# Patient Record
Sex: Female | Born: 1965
Health system: Southern US, Community
[De-identification: ages and names within clinical notes are randomized; demographics above are authoritative.]

## PROBLEM LIST (undated history)

## (undated) DIAGNOSIS — E119 Type 2 diabetes mellitus without complications: Secondary | ICD-10-CM

## (undated) DIAGNOSIS — B019 Varicella without complication: Secondary | ICD-10-CM

## (undated) HISTORY — DX: Varicella without complication: B01.9

## (undated) HISTORY — PX: ABDOMINAL HYSTERECTOMY: SHX81

## (undated) HISTORY — PX: OTHER SURGICAL HISTORY: SHX169

---

## 2000-02-08 ENCOUNTER — Other Ambulatory Visit: Admission: RE | Admit: 2000-02-08 | Discharge: 2000-02-08 | Payer: Self-pay | Admitting: Obstetrics and Gynecology

## 2002-06-24 ENCOUNTER — Encounter: Admission: RE | Admit: 2002-06-24 | Discharge: 2002-06-24 | Payer: Self-pay | Admitting: Family Medicine

## 2002-06-24 ENCOUNTER — Encounter: Payer: Self-pay | Admitting: Family Medicine

## 2003-01-15 DIAGNOSIS — R87619 Unspecified abnormal cytological findings in specimens from cervix uteri: Secondary | ICD-10-CM | POA: Insufficient documentation

## 2004-12-02 ENCOUNTER — Emergency Department: Payer: Self-pay | Admitting: Emergency Medicine

## 2005-01-02 ENCOUNTER — Observation Stay (HOSPITAL_COMMUNITY): Admission: RE | Admit: 2005-01-02 | Discharge: 2005-01-03 | Payer: Self-pay | Admitting: Obstetrics and Gynecology

## 2005-01-02 ENCOUNTER — Encounter (INDEPENDENT_AMBULATORY_CARE_PROVIDER_SITE_OTHER): Payer: Self-pay | Admitting: Specialist

## 2005-07-26 ENCOUNTER — Ambulatory Visit: Payer: Self-pay | Admitting: Unknown Physician Specialty

## 2005-08-20 ENCOUNTER — Ambulatory Visit: Payer: Self-pay

## 2006-04-10 ENCOUNTER — Encounter: Admission: RE | Admit: 2006-04-10 | Discharge: 2006-04-10 | Payer: Self-pay | Admitting: Obstetrics and Gynecology

## 2006-04-11 ENCOUNTER — Ambulatory Visit: Payer: Self-pay

## 2006-07-29 ENCOUNTER — Encounter (INDEPENDENT_AMBULATORY_CARE_PROVIDER_SITE_OTHER): Payer: Self-pay | Admitting: Radiology

## 2006-07-29 ENCOUNTER — Encounter: Admission: RE | Admit: 2006-07-29 | Discharge: 2006-07-29 | Payer: Self-pay | Admitting: Obstetrics and Gynecology

## 2006-12-17 ENCOUNTER — Ambulatory Visit: Payer: Self-pay | Admitting: Unknown Physician Specialty

## 2007-04-13 ENCOUNTER — Encounter: Admission: RE | Admit: 2007-04-13 | Discharge: 2007-04-13 | Payer: Self-pay | Admitting: General Surgery

## 2007-05-11 ENCOUNTER — Encounter (INDEPENDENT_AMBULATORY_CARE_PROVIDER_SITE_OTHER): Payer: Self-pay | Admitting: General Surgery

## 2007-05-11 ENCOUNTER — Ambulatory Visit (HOSPITAL_BASED_OUTPATIENT_CLINIC_OR_DEPARTMENT_OTHER): Admission: RE | Admit: 2007-05-11 | Discharge: 2007-05-11 | Payer: Self-pay | Admitting: General Surgery

## 2007-09-22 ENCOUNTER — Encounter: Admission: RE | Admit: 2007-09-22 | Discharge: 2007-09-22 | Payer: Self-pay | Admitting: Obstetrics and Gynecology

## 2008-02-24 ENCOUNTER — Encounter: Admission: RE | Admit: 2008-02-24 | Discharge: 2008-02-24 | Payer: Self-pay | Admitting: Obstetrics and Gynecology

## 2008-02-25 ENCOUNTER — Encounter (INDEPENDENT_AMBULATORY_CARE_PROVIDER_SITE_OTHER): Payer: Self-pay | Admitting: Surgery

## 2008-02-25 ENCOUNTER — Inpatient Hospital Stay (HOSPITAL_COMMUNITY): Admission: AD | Admit: 2008-02-25 | Discharge: 2008-02-26 | Payer: Self-pay | Admitting: Surgery

## 2008-07-04 ENCOUNTER — Ambulatory Visit (HOSPITAL_BASED_OUTPATIENT_CLINIC_OR_DEPARTMENT_OTHER): Admission: RE | Admit: 2008-07-04 | Discharge: 2008-07-04 | Payer: Self-pay | Admitting: Surgery

## 2008-07-04 ENCOUNTER — Encounter (INDEPENDENT_AMBULATORY_CARE_PROVIDER_SITE_OTHER): Payer: Self-pay | Admitting: Surgery

## 2009-01-31 ENCOUNTER — Ambulatory Visit: Payer: Self-pay | Admitting: Family Medicine

## 2009-03-29 ENCOUNTER — Ambulatory Visit: Payer: Self-pay | Admitting: Family Medicine

## 2010-01-03 ENCOUNTER — Ambulatory Visit: Payer: Self-pay | Admitting: Family Medicine

## 2010-02-04 ENCOUNTER — Encounter: Payer: Self-pay | Admitting: General Surgery

## 2010-02-04 ENCOUNTER — Encounter: Payer: Self-pay | Admitting: Obstetrics and Gynecology

## 2010-02-05 ENCOUNTER — Encounter: Payer: Self-pay | Admitting: Obstetrics and Gynecology

## 2010-05-01 LAB — COMPREHENSIVE METABOLIC PANEL
BUN: 8 mg/dL (ref 6–23)
Calcium: 8.4 mg/dL (ref 8.4–10.5)
Glucose, Bld: 118 mg/dL — ABNORMAL HIGH (ref 70–99)
Sodium: 135 mEq/L (ref 135–145)
Total Protein: 7.2 g/dL (ref 6.0–8.3)

## 2010-05-01 LAB — CULTURE, ROUTINE-ABSCESS

## 2010-05-01 LAB — CBC
MCHC: 34.2 g/dL (ref 30.0–36.0)
MCV: 91.8 fL (ref 78.0–100.0)
RBC: 3.81 MIL/uL — ABNORMAL LOW (ref 3.87–5.11)

## 2010-05-01 LAB — MAGNESIUM: Magnesium: 1.8 mg/dL (ref 1.5–2.5)

## 2010-05-01 LAB — ANAEROBIC CULTURE

## 2010-05-09 ENCOUNTER — Ambulatory Visit: Payer: Self-pay | Admitting: Orthopedic Surgery

## 2010-05-29 NOTE — Op Note (Signed)
[name], MARCONI NO.:  [id number]   MEDICAL RECORD NO.:  [id number]          PATIENT TYPE:  INP   LOCATION:  2609                         FACILITY:  [facility]   PHYSICIAN:  Currie Paris, M.D.DATE OF BIRTH:  May 15, 1965   DATE OF PROCEDURE:  DATE OF DISCHARGE:                               OPERATIVE REPORT   PREOPERATIVE DIAGNOSIS:  Recurrent breast abscess, mastitis.   POSTOPERATIVE DIAGNOSIS:  Recurrent breast abscess, mastitis.   OPERATION:  Drainage of chronic breast abscess with biopsy of subareolar  tissue.   SURGEON:  Currie Paris, MD   ANESTHESIA:  General.   CLINICAL HISTORY:  This is a 45 year old lady who has presented back to  the office with a painful tender red mass just medial aspect of the  areola.  She has had previous infection there, which was operated on and  debrided by Dr. Carolynne Edouard approximately 9 months ago.  At this time, she has  been on doxycycline for a week with essentially no improvement.  In  fact, the area has gotten a little bit worse.  Because of this, she came  to the office and after evaluation, we thought that she should go to the  operating room for a debridement.  We discussed the situation and the  fact that this may well start from her nipple and at some point, we may  have to go back and either do a nipple excision or debridement of some  ductal structures, but at this time, we thought best to just open the  area up and get a better culture.   DESCRIPTION OF PROCEDURE:  The patient was seen in the holding area and  we reviewed the plans as noted above.  I initialed the right breast as  the operative site.   The patient was taken to the operating room.  After satisfactory general  anesthesia had been obtained, the breast was prepped and draped.  The  time-out was done.   I made a curvilinear incision at the medial aspect of the areola and  entered very thickened subcutaneous tissue with what looked like  some  old sebum.  There was also some fairly hard sub-nipple tissue that  looked like chronic inflammation.  I debrided this out and tried to get  into some fresh clean, fatty subcutaneous tissue.  I did send portions  of the tissue for pathology and also took some sloughs for cultures,  although there was not a lot of frank pus, just a little bit of purulent  material present and mainly inflammatory changes.   I placed some moist 2 x 2's in for packing.  Sterile dressings.  The  patient tolerated the procedure well and there were no complications.      Currie Paris, M.D.  Electronically Signed     CJS/MEDQ  D:  02/26/2008  T:  02/26/2008  Job:  [id number]

## 2010-05-29 NOTE — Op Note (Signed)
NAMESHIRON, WHETSEL              ACCOUNT NO.:  [id number]   MEDICAL RECORD NO.:  [id number]          PATIENT TYPE:  AMB   LOCATION:  DSC                          FACILITY:  [facility]   PHYSICIAN:  Currie Paris, M.D.DATE OF BIRTH:  1965/11/18   DATE OF PROCEDURE:  07/04/2008  DATE OF DISCHARGE:                               OPERATIVE REPORT   PREOPERATIVE DIAGNOSIS:  Recurring subareolar right breast abscesses.   POSTOPERATIVE DIAGNOSIS:  Recurring subareolar right breast abscesses.   OPERATION:  Excision of right nipple and subareolar tissue.   SURGEON:  Currie Paris, MD   ANESTHESIA:  General.   CLINICAL HISTORY:  Ms. Toft is a 45 year old lady who has had  recurrent subareolar abscesses with 2 trips to the operating room for  drainage and more recently another episode drained in the office.  This  area has resolved.  I thought that these had been coming from some  chronic ductal infection and that either opening up of that abduct or  excision of the nipple would be likely to eliminate these recurring  infections.  After discussion with the patient, she agreed to have that  done.  She really wishes to take whole nipple off rather than to risk  any chance that we would have to come back if we just did a ductal  opening up and I told I was truly be able to find the duct in question.  In addition, I warned her that she might have recurring infections even  with this being done, but I thought that this would diminish the chance  for that to happen.   DESCRIPTION OF PROCEDURE:  The patient was seen in the holding area and  we reviewed the plans for surgery and she had no further questions.  We  both identified AND marked the right breast as the operative side.   The patient was taken to the operating room and after satisfactory  general anesthesia had been obtained the right breast was prepped and  draped.  The time-out was done.   I made a circular incision  around the right nipple and then extended it  medially across the scar from where her last abscess had been drained.  I then excised the nipple with this subjacent ductal tissue making sure  that I got out anything that looked like chronic inflammation.  There  was no evidence of acute infection today.   Once I made sure everything was dry, I put Marcaine in to help with  postop analgesia.  I closed the skin with some 4-0 nylon sutures and  sterile dressings were applied.   The patient tolerated the procedure well and there were no  complications.      Currie Paris, M.D.  Electronically Signed     CJS/MEDQ  D:  07/04/2008  T:  07/05/2008  Job:  [id number]   cc:   Hal Morales, M.D.

## 2010-05-29 NOTE — Op Note (Signed)
NAMEYOLTZIN, Madison Armstrong              ACCOUNT NO.:  [id number]   MEDICAL RECORD NO.:  [id number]          PATIENT TYPE:  AMB   LOCATION:  DSC                          FACILITY:  [facility]   PHYSICIAN:  Ollen Gross. Vernell Morgans, M.D. DATE OF BIRTH:  [birth year]   DATE OF PROCEDURE:  05/11/2007  DATE OF DISCHARGE:                               OPERATIVE REPORT   PREOPERATIVE DIAGNOSIS:  Right breast mass, possible recurrent abscess.   POSTOPERATIVE DIAGNOSIS:  Right breast mass, possible recurrent abscess.   PROCEDURE:  Excision of right breast mass.   SURGEON:  Ollen Gross. Vernell Morgans, MD   ANESTHESIA:  General via LMA.   PROCEDURE:  After informed consent was obtained, the patient was brought  to the operating room and placed in supine position on the operating  table.  After an induction of general anesthesia, the patient's right  breast was prepped with Betadine and draped in usual sterile manner.  Just medial to the nipple in the right breast, the patient had a small  palpable mass.  This was much smaller than it was several weeks when we  saw her in the clinic.  She had been on doxycycline for this with  improvement.  There is no redness or sign of infection today.  A small  curvilinear incision was made on the medial edge of the areola adjacent  to this mass with a 15 blade knife.  This incision was carried down  through the skin and into the subcutaneous tissue sharply with the  electrocautery and tenotomy scissors.  The mass itself could be  palpated.  The mass was then excised from the rest of the subcutaneous  tissue underneath the nipple and areola.  This was all done sharply with  tenotomy scissors.  Once the mass was removed, it was sent to pathology  for further evaluation.  Hemostasis was achieved using the Bovie  electrocautery.  The wound was irrigated with copious amounts of saline.  No signs of infection were identified.  The wound was then infiltrated  with 0.25% Marcaine.  The deep  layer of the wound was closed with  interrupted 3-0 Vicryl stitch and the skin was closed with interrupted 4-  0 Monocryl subcuticular stitches.  Dermabond dressing was applied.  The  patient tolerated the procedure well.  At the end of the case, all  needle, sponge, and instrument counts were correct.  The patient was  then awakened and taken to recovery room in stable condition.      Ollen Gross. Vernell Morgans, M.D.  Electronically Signed     PST/MEDQ  D:  05/11/2007  T:  05/11/2007  Job:  [id number]

## 2010-05-29 NOTE — Consult Note (Signed)
Madison Armstrong, SWARTZLANDER              ACCOUNT NO.:  [id number]   MEDICAL RECORD NO.:  [id number]          PATIENT TYPE:  INP   LOCATION:  2609                         FACILITY:  [facility]   PHYSICIAN:  Pramod P. Pearlean Brownie, MD    DATE OF BIRTH:  10-18-1965   DATE OF CONSULTATION:  DATE OF DISCHARGE:                                 CONSULTATION   REFERRING PHYSICIAN:  Currie Paris, MD   REASON FOR REFERRAL:  Altered mental status and involuntary movements.   CLINICAL HISTORY:  This is a 45 year old African American lady who had  incision and drainage of breast today.  Procedure was uneventful.  She  was in the Post Anesthesia Recovery Unit and was initially alright for  15 minutes.  She then complained of severe pain in the breast, was given  1 mg of IV Dilaudid.  Following that, she started having some bizarre  involuntary movements, involving the right upper extremity more than  left as well as head and neck, which was intermittent.  She has also not  been responsive, opening her eyes, or responding to painful stimuli.  Intermittently, however, she has been following commands, moving  extremities purposefully.  She does not complain of headache.  There is  no focal extremity weakness noted.  The patient is unable to provide  history as she speaks very little and no detailed history is available  in the chart.   PHYSICAL EXAMINATION:  GENERAL:  She is lying comfortably.  VITAL SIGNS:  Her vital signs are stable.  She is afebrile.  NEUROLOGIC:  She is lying in bed with eyes closed.  She does open eyes  to command.  Her eyes are in primary position.  She has a bizarre  involuntary jerky movements involving the right upper extremity,  shoulder, head and neck, and location of the left arm as well.  These do  not appear to be in sink.  When she is distracted, these movements are  less.  When I raise her arm up and hold it in my hand, she stops doing  it.  She moves all 4 extremities against  gravity and can support all  extremities well, however, when she is given a painful stimuli, she does  not respond appropriately in her feet.  Her reflexs are 1+ symmetric.  Plantars are down going.  Her coordination, sensation, gait cannot be  tested reliably.   Data reviewed and present chart was reviewed.   IMPRESSION:  A 45 year old lady with some onset of depressed sensorium  and involuntary movements, involving head, neck, and upper extremities  in a bizarre fashion.  I doubt this a present stroke or seizure.  Conversion reaction or adverse reaction to Dilaudid more likely.  I will  recommend admitting overnight for observation.  Use nonnarcotic pain  medications and avoid narcotics.  Consider elective psychiatric referral  for counseling.  Kindly call for questions.           ______________________________  Sunny Schlein. Pearlean Brownie, MD     PPS/MEDQ  D:  02/25/2008  T:  02/26/2008  Job:  [id number]

## 2010-06-01 NOTE — Discharge Summary (Signed)
Madison Armstrong, Madison Armstrong NO.:  [id number]   MEDICAL RECORD NO.:  [id number]          PATIENT TYPE:  INP   LOCATION:  2609                         FACILITY:  [facility]   PHYSICIAN:  Currie Paris, M.D.DATE OF BIRTH:  16-Apr-1965   DATE OF ADMISSION:  02/25/2008  DATE OF DISCHARGE:  02/26/2008                               DISCHARGE SUMMARY   FINAL DIAGNOSES:  1. Recurrent right breast abscess.  2. Adverse reaction (possible) to scopolamine patch.  3. History of multiple sclerosis.   CLINICAL HISTORY:  [name] is a 45 year old lady who has had a  recurrent breast abscess that needed to be drained under general  anesthesia.  She had been seen in the office earlier in the day and  scheduled for the afternoon date of admission.   HOSPITAL COURSE:  The patient was taken to the operating room and  underwent drainage and debridement of a subareolar breast abscess with  biopsies of some of the tissues.  She tolerated the procedure well.   Postoperatively, she had some very dystonic movements, unable to see  rolling eyes, poor response to deep pain.  Neurological consultation was  obtained.  It was unclear what the reaction was but it was then noted  from her husband that she had multiple sclerosis diagnosed in 1997,  treated at  [facility] but on no meds.  It was thought perhaps because of  multiple sclerosis, she had an adverse reaction to her scopolamine patch  which was removed.   By the next morning, she was much improved and neurologically stable.  After discussing situation with her husband, she had had similar events  after her hysterectomy in 1990 when pregnant.  It is not clear what this  etiology is and of note is the fact that she had not mentioned this in  any of her preoperative evaluations to either her surgeons or the  anesthesia doctors.   At any rate, the wound was cleaned with packing and she was discharged  to be sent home and followed up in our  office for wound care.  She will  make her routine followups regarding her multiple sclerosis with her  other physicians.      Currie Paris, M.D.  Electronically Signed     CJS/MEDQ  D:  03/31/2008  T:  03/31/2008  Job:  [id number]

## 2010-06-01 NOTE — H&P (Signed)
NAME:  Madison Armstrong, Madison Armstrong              ACCOUNT NO.:  [id number]   MEDICAL RECORD NO.:  [id number]          PATIENT TYPE:  AMB   LOCATION:                                [facility]   PHYSICIAN:  Naima A. Dillard, M.D. DATE OF BIRTH:  [birth year]   DATE OF ADMISSION:  01/02/2005  DATE OF DISCHARGE:                                HISTORY & PHYSICAL   CHIEF COMPLAINT:  Metrorrhagia and pelvic pain.   The patient is a 45 year old, gravida 2, para 2, presented back to me on  November 06, 2004 for a second opinion.  She was slated to have a  hysterectomy for pelvic and metrorrhagia.  The patient states she was  diagnosed with adenomyosis on ultrasound and MRI and reports she had an  endometrial biopsy which was benign.  She states that her menses occur every  two weeks and last for 3-5 days.  The patient denies having any history of  fibroids or being on hormone therapy.  She has denied being on any new  medications, does not have any menopausal symptoms.  Does have some colicky  abdominal pain and  TSH, prolactin within normal limits.  Ultrasound  significant for uterus measuring 9.4 x 6.29 x 6.6 and a hypoechoic area in  the endometrium which was significant for a questionable polyp.  The patient  had an endometrial biopsy done by me, on November 30, 2004, which showed  secretory endometrium.   PAST OBSTETRICAL HISTORY:  Vaginal delivery x2 without any complication.   PAST MEDICAL HISTORY:  As above.   FAMILY HISTORY:  Maternal grandmother with ovarian cancer.   SOCIAL HISTORY:  Negative for tobacco, alcohol, and drug abuse.   PAST SURGICAL HISTORY:  1.  Laparoscopic ovarian cystectomy in 2005.  2.  Bilateral tubal ligation.   The patient has no known drug allergies.   MEDICATIONS:  None.   REVIEW OF SYSTEMS:  GENITOURINARY:  As above.  GI:  Unremarkable.  RESPIRATORY:  Unremarkable.  MUSCULOSKELETAL:  Unremarkable.  PSYCHIATRIC:  Unremarkable.   PHYSICAL EXAMINATION:  VITAL  SIGNS:  The patient weighs 235 pounds.  Her  blood pressure is 110/60.  HEENT:  Pupils are equal.  Hearing is normal.  Throat is clear.  Thyroid is  not enlarged.  HEART:  Regular rate and rhythm.  LUNGS:  Clear to auscultation bilaterally.  BREASTS:  Have no masses, discharge, skin changes, or nipple retraction.  BACK:  No CVA tenderness.  ABDOMEN:  Nontender without any masses or organomegaly.  EXTREMITIES:  No cyanosis, clubbing, or edema.  NEURO:  Within normal limits.  PELVIC:  Vulva vaginal exam within normal limits.  Cervix is nontender  without any lesions.  Uterus is top normal size, mobile, nontender.  Adnexa  is no masses and nontender.   ASSESSMENT:  1.  Metrorrhagia.  2.  Pelvic pain.  3.  Questionable adenomyosis.   PLAN:  All treatments were reviewed with the patient including but not  limited to observation, hormone treatment, D&C with ablation, Mirena, and  hysterectomy.  The patient has decided upon a hysterectomy.  We  will plan a  laparoscopically-assisted vaginal hysterectomy.  With some history of pelvic  pain, she may have some adhesions.  The patient understands that a  hysterectomy may not remedy her pain especially if endometriosis is found.  The patient desires to retain her ovaries.  She understands the risk of  hysterectomy include but are not limited to bleeding, infection, damage to  internal organs such as bowel, bladder, major blood vessels.  She also  signed a hysterectomy consent in the office.      Naima A. Normand Sloop, M.D.  Electronically Signed     NAD/MEDQ  D:  01/01/2005  T:  01/01/2005  Job:  [id number]

## 2010-06-01 NOTE — Op Note (Signed)
NAME:  Madison Armstrong, Madison Armstrong              ACCOUNT NO.:  [id number]   MEDICAL RECORD NO.:  [id number]          PATIENT TYPE:  AMB   LOCATION:  SDC                           FACILITY:  [facility]   PHYSICIAN:  Naima A. Dillard, M.D. DATE OF BIRTH:  Sep 05, 1965   DATE OF PROCEDURE:  01/02/2005  DATE OF DISCHARGE:                                 OPERATIVE REPORT   PREOPERATIVE DIAGNOSES:  1.  Metrorrhagia and pelvic pain.  2.  Questionable adenomyosis.   POSTOPERATIVE DIAGNOSES:  1.  Metrorrhagia and pelvic pain.  2.  Questionable adenomyosis.  3.  Pelvic adhesions.   OPERATION/PROCEDURE:  Laparoscopic-assisted vaginal hysterectomy with lysis  of adhesions.   SURGEON:  Naima A. Normand Sloop, M.D.   ASSISTANTMarquis Lunch. Powell, P.A.-C.   ANESTHESIA:  General endotracheal tube and local anesthesia.   SPECIMENS:  Uterus and cervix.   ESTIMATED BLOOD LOSS:  500 mL.   URINARY OUTPUT:  750 mL.   IV FLUIDS:  2500 mL crystalloid.   COMPLICATIONS:  None.   DISPOSITION:  The patient went to PACU in stable condition.   DESCRIPTION OF PROCEDURE:  The patient was taken to the operating room where  she was given general anesthesia and placed in the dorsal lithotomy position  and prepped and draped in the normal sterile fashion.  A bivalve speculum  was placed into the vagina.  The anterior lip of the cervix grasped with a  single-tooth tenaculum.  The Hulka manipulator was placed into the cervix  and attention was then turned to the abdomen where a 10 mm infraumbilical  horizontal incision was made with the scalpel after 5 mL of 20.5% Marcaine  was injected.  This was carried down to the fascia.  The fascia was then  incised in the midline.  The peritoneum was tented up and incised and  dissected bilaterally.  A 0 Vicryl was used as a pursestring in the fascia.  The Hasson was then placed into the intra-abdominal cavity and anchored with  the pursestring suture.  Intra-abdominal insufflation was done  with CO2 gas.  The patient had about an eight-week size uterus, very boggy in appearance,  normal-appearing right ovary.  No tubes bilaterally.  No tubes were in  place.  She had a tubal ligation in the past and, as stated, bilateral  salpingectomies.  Her left ovary was adherent to the uterus and left  sidewall.  In the right lower quadrant and left lower quadrant, just below  the umbilicus, two 5 mm trocars were placed after 5 mL 0.25% Marcaine was  injected.  Hemostasis was assured. The 5 mm trocars were placed under direct  visualization with the laparoscope.   Attention was then turned to both round ligaments which were cauterized and  cut.  The bladder flap was then dissected with water using the jet, and the  vesicouterine peritoneum was identified, tented up and entered sharply.  The  bladder flap was created digitally.  The patient's left ovary was adherent  to the left sidewall.  The ureter was found and the ovary, through lysis of  adhesions, both  sharp and dull lysis, was done to free the ovary from the  left sidewall.  Again the ureter was visualized and noted to not be  hindered.  The patient's utero-ovarian ligaments were cauterized and cut.  Hemostasis was assured.  The patient's right utero-ovarian ligament was  cauterized and cut.  Hemostasis was assured.   Attention was then turned to the vagina where a weighted speculum was placed  into the vagina and 20 mL of Pitressin, 20 units in 100 mL mixture, was  placed circumferentially around the cervix.  A circumferential incision was  then made.  The bladder was then dissected away from the cervix and the  rectum was dissected away from the posterior __________ cervix and uterus  both bluntly and sharply.  The posterior cul-de-sac was entered without  difficulty.  The anterior cul-de-sac was also entered without difficulty.  No damage to the rectum or the bladder was done.  Both uterosacral ligaments  were clamped and cut.   Hemostasis was assured.  Both cardinal ligaments were  clamped, cut and suture ligated.  Hemostasis was assured.  Both uterine  arteries were clamped, cut and suture ligated.  Hemostasis was assured.  The  uterus was removed.  There was some bleeding from where the lysis of  adhesions along the pelvic sidewall.  This was made hemostatic with free tie  suture . The McCall suture was then placed and the vaginal cuff was closed  with 0 chromic.  A sponge on a stick was placed in the vagina.   Attention was then turned to the abdomen.  It was reinsufflated and the area  along the lysis of adhesions along the sidewall still had some bleeding.  Her ureter was found to be peristalsing and free and away from the area that  was bleeding.  This was made hemostatic with cautery and some Gelfoam.  Irrigation was then done and noted to be hemostatic.  The patient's  abdominal anatomy was noted to be normal.  Her appendix was seen and noted  to be normal.  Both trocars were removed under direct visualization of the  laparoscope.  There was some minimal amount of bleeding from the left trocar  site which was made hemostatic with cautery along the anterior abdominal  wall where the trocar was placed.  The gas was allowed to leave the, abdomen  and again hemostasis was assured.  All instruments were removed from the  abdomen.  The pursestring suture was closed just by tying the suture.  Fascia was closed by just tying the pursestring suture of the umbilicus.  Skin incisions were closed with 3-0 Monocryl.  The vagina was packed with  vaginal packing.  Sponge, lap and needle counts were correct x2.  The  patient went to the recovery room in stable condition.      Naima A. Normand Sloop, M.D.  Electronically Signed     NAD/MEDQ  D:  01/02/2005  T:  01/03/2005  Job:  [id number]

## 2010-10-09 LAB — BASIC METABOLIC PANEL
Chloride: 106
GFR calc Af Amer: >60
Potassium: 4.3

## 2010-10-09 LAB — DIFFERENTIAL
Eosinophils Absolute: 0.1
Eosinophils Relative: 1
Lymphs Abs: 2.1
Monocytes Relative: 7
Neutrophils Relative %: 69

## 2010-10-09 LAB — CBC
HCT: 36.7
MCV: 91.3
RBC: 4.02
WBC: 9.3

## 2010-10-30 ENCOUNTER — Other Ambulatory Visit: Payer: Self-pay | Admitting: Obstetrics and Gynecology

## 2010-10-30 DIAGNOSIS — N644 Mastodynia: Secondary | ICD-10-CM

## 2010-11-09 ENCOUNTER — Ambulatory Visit
Admission: RE | Admit: 2010-11-09 | Discharge: 2010-11-09 | Disposition: A | Discharge status: Home / Self Care | Payer: Self-pay | Source: Ambulatory Visit | Attending: Obstetrics and Gynecology | Admitting: Obstetrics and Gynecology

## 2010-11-09 DIAGNOSIS — N644 Mastodynia: Secondary | ICD-10-CM

## 2011-06-06 ENCOUNTER — Ambulatory Visit: Payer: Self-pay | Admitting: Family Medicine

## 2011-07-26 ENCOUNTER — Ambulatory Visit: Payer: Self-pay | Admitting: Emergency Medicine

## 2011-07-26 LAB — COMPREHENSIVE METABOLIC PANEL
Albumin: 3.5 g/dL (ref 3.4–5.0)
Alkaline Phosphatase: 75 U/L (ref 50–136)
Anion Gap: 7 (ref 7–16)
BUN: 12 mg/dL (ref 7–18)
Bilirubin,Total: 0.3 mg/dL (ref 0.2–1.0)
Calcium, Total: 8.9 mg/dL (ref 8.5–10.1)
Chloride: 102 mmol/L (ref 98–107)
Co2: 28 mmol/L (ref 21–32)
Creatinine: 1.04 mg/dL (ref 0.60–1.30)
EGFR (African American): >60
EGFR (Non-African Amer.): >60
Glucose: 78 mg/dL (ref 65–99)
Osmolality: 272 (ref 275–301)
Potassium: 3.9 mmol/L (ref 3.5–5.1)
SGOT(AST): 15 U/L (ref 15–37)
SGPT (ALT): 26 U/L
Sodium: 137 mmol/L (ref 136–145)
Total Protein: 8.2 g/dL (ref 6.4–8.2)

## 2011-07-26 LAB — CBC WITH DIFFERENTIAL/PLATELET
Basophil #: 0.1 10*3/uL (ref 0.0–0.1)
Basophil %: 1.2 %
Eosinophil #: 0.1 10*3/uL (ref 0.0–0.7)
Eosinophil %: 1 %
HCT: 38.9 % (ref 35.0–47.0)
HGB: 13 g/dL (ref 12.0–16.0)
Lymphocyte #: 1.8 10*3/uL (ref 1.0–3.6)
Lymphocyte %: 23.7 %
MCH: 31 pg (ref 26.0–34.0)
MCHC: 33.4 g/dL (ref 32.0–36.0)
MCV: 93 fL (ref 80–100)
Monocyte #: 1 x10 3/mm — ABNORMAL HIGH (ref 0.2–0.9)
Monocyte %: 13.2 %
Neutrophil #: 4.5 10*3/uL (ref 1.4–6.5)
Neutrophil %: 60.9 %
Platelet: 302 10*3/uL (ref 150–440)
RBC: 4.18 10*6/uL (ref 3.80–5.20)
RDW: 12.8 % (ref 11.5–14.5)
WBC: 7.4 10*3/uL (ref 3.6–11.0)

## 2011-07-26 LAB — AMYLASE: Amylase: 51 U/L (ref 25–115)

## 2011-07-26 LAB — LIPASE, BLOOD: Lipase: 113 U/L (ref 73–393)

## 2011-07-30 ENCOUNTER — Ambulatory Visit: Payer: Self-pay | Admitting: Emergency Medicine

## 2011-08-05 ENCOUNTER — Ambulatory Visit: Payer: Self-pay | Admitting: Surgery

## 2011-09-25 ENCOUNTER — Emergency Department: Payer: Self-pay | Admitting: Emergency Medicine

## 2011-09-25 LAB — CBC
HCT: 38.4 % (ref 35.0–47.0)
HGB: 12.8 g/dL (ref 12.0–16.0)
MCHC: 33.3 g/dL (ref 32.0–36.0)
RBC: 4.22 10*6/uL (ref 3.80–5.20)

## 2011-09-25 LAB — BASIC METABOLIC PANEL
Anion Gap: 10 (ref 7–16)
BUN: 12 mg/dL (ref 7–18)
Calcium, Total: 9 mg/dL (ref 8.5–10.1)
EGFR (Non-African Amer.): >60
Glucose: 116 mg/dL — ABNORMAL HIGH (ref 65–99)
Osmolality: 282 (ref 275–301)
Potassium: 4.2 mmol/L (ref 3.5–5.1)

## 2011-12-16 ENCOUNTER — Telehealth: Payer: Self-pay | Admitting: Obstetrics and Gynecology

## 2011-12-16 NOTE — Telephone Encounter (Signed)
Tc to pt regarding msg.  Lm on vm to call back. 

## 2011-12-19 NOTE — Telephone Encounter (Signed)
Pt returned call regarding msg.  Message forwarded to ND regarding pt concerns.  Pt informed will need an annual exam and will have breast exam and discuss options at that time.  Pt scheduled for annual on Thursday 01/02/12 @ 0945 w/ ND.  Pt voices agreement.

## 2012-01-02 ENCOUNTER — Ambulatory Visit (INDEPENDENT_AMBULATORY_CARE_PROVIDER_SITE_OTHER): Payer: BC Managed Care – PPO | Admitting: Obstetrics and Gynecology

## 2012-01-02 ENCOUNTER — Encounter: Payer: Self-pay | Admitting: Obstetrics and Gynecology

## 2012-01-02 VITALS — BP 118/76 | Ht 70.0 in | Wt 219.0 lb

## 2012-01-02 DIAGNOSIS — N644 Mastodynia: Secondary | ICD-10-CM

## 2012-01-02 DIAGNOSIS — Z124 Encounter for screening for malignant neoplasm of cervix: Secondary | ICD-10-CM

## 2012-01-02 NOTE — Patient Instructions (Signed)
Breast Self-Awareness  Practicing breast self-awareness may pick up problems early, prevent significant medical complications, and possibly save your life. By practicing breast self-awareness, you can become familiar with how your breasts look and feel and if your breasts are changing. This allows you to notice changes early. It can also offer you some reassurance that your breast health is good. One way to learn what is normal for your breasts and whether your breasts are changing is to do a breast self-exam.  If you find a lump or something that was not present in the past, it is best to contact your caregiver right away. Other findings that should be evaluated by your caregiver include nipple discharge, especially if it is bloody; skin changes or reddening; areas where the skin seems to be pulled in (retracted); or new lumps and bumps. Breast pain is seldom associated with cancer (malignancy), but should also be evaluated by a caregiver.  BREAST SELF-EXAM  The best time to examine your breasts is 5 7 days after your menstrual period is over. During menstruation, the breasts are lumpier, and it may be more difficult to pick up changes. If you do not menstruate, have reached menopause, or had your uterus removed (hysterectomy), you should examine your breasts at regular intervals, such as monthly. If you are breastfeeding, examine your breasts after a feeding or after using a breast pump. Breast implants do not decrease the risk for lumps or tumors, so continue to perform breast self-exams as recommended. Talk to your caregiver about how to determine the difference between the implant and breast tissue. Also, talk about the amount of pressure you should use during the exam. Over time, you will become more familiar with the variations of your breasts and more comfortable with the exam. A breast self-exam requires you to remove all your clothes above the waist.    Look at your breasts and nipples. Stand in front of  a mirror in a room with good lighting. With your hands on your hips, push your hands firmly downward. Look for a difference in shape, contour, and size from one breast to the other (asymmetry). Asymmetry includes puckers, dips, or bumps. Also, look for skin changes, such as reddened or scaly areas on the breasts. Look for nipple changes, such as discharge, dimpling, repositioning, or redness.   Carefully feel your breasts. This is best done either in the shower or tub while using soapy water or when flat on your back. Place the arm (on the side of the breast you are examining) above your head. Use the pads (not the fingertips) of your three middle fingers on your opposite hand to feel your breasts. Start in the underarm area and use  inch (2 cm) overlapping circles to feel your breast. Use 3 different levels of pressure (light, medium, and firm pressure) at each circle before moving to the next circle. The light pressure is needed to feel the tissue closest to the skin. The medium pressure will help to feel breast tissue a little deeper, while the firm pressure is needed to feel the tissue close to the ribs. Continue the overlapping circles, moving downward over the breast until you feel your ribs below your breast. Then, move one finger-width towards the center of the body. Continue to use the  inch (2 cm) overlapping circles to feel your breast as you move slowly up toward the collar bone (clavicle) near the base of the neck. Continue the up and down exam using all 3 pressures   until you reach the middle of the chest. Do this with each breast, carefully feeling for lumps or changes.   Keep a written record with breast changes or normal findings for each breast. By writing this information down, you do not need to depend only on memory for size, tenderness, or location. Write down where you are in your menstrual cycle, if you are still menstruating.   Breast tissue can have some lumps or thick tissue. However,  see your caregiver if you find anything that concerns you.   SEEK MEDICAL CARE IF:   You see a change in shape, contour, or size of your breasts or nipples.    You see skin changes, such as reddened or scaly areas on the breasts or nipples.    You have an unusual discharge from your nipples.    You feel a new lump or unusually thick areas.   Document Released: 12/31/2004 Document Revised: 07/02/2011 Document Reviewed: 04/17/2011  [employer] Patient Information 2013 [employer].

## 2012-01-02 NOTE — Progress Notes (Signed)
Last Pap: 10/30/2010 WNL: Yes Regular Periods:no Contraception: Hyst   Monthly Breast exam:yes Tetanus<54yrs:yes Nl.Bladder Function:yes Daily BMs:yes Healthy Diet:yes Calcium:yes Mammogram:yes Date of Mammogram: 11/09/2010 Exercise:yes Have often Exercise: 2-3 times a week  Seatbelt: yes Abuse at home: no Stressful work:yes PCP: [facility]  Change in PMH: unchanged  Change in AVW:[id number] passed away from heart attack  Pt stated needs a mammogram itching and burning in left breast . No nipple discharge.no masses BP 118/76  Ht 5\' 10"  (1.778 m)  Wt 219 lb (99.338 kg)  BMI 31.42 kg/m2 Pt with complaints:yes she has itching on her left breast with some warmth.   Physical Examination: General appearance - alert, well appearing, and in no distress Mental status - normal mood, behavior, speech, dress, motor activity, and thought processes Neck - supple, no significant adenopathy,  thyroid exam: thyroid is normal in size without nodules or tenderness Chest - clear to auscultation, no wheezes, rales or rhonchi, symmetric air entry Heart - normal rate and regular rhythm Abdomen - soft, nontender, nondistended, no masses or organomegaly Breasts - breasts appear normal, no suspicious masses, no skin or nipple changes or axillary nodes Pelvic - normal external genitalia, vulva, vagina, and adnexa uterus and cervix absent Rectal - normal rectal, no masses, rectal exam not indicated Back exam - full range of motion, no tenderness, palpable spasm or pain on motion Neurological - alert, oriented, normal speech, no focal findings or movement disorder noted Musculoskeletal - no joint tenderness, deformity or swelling Extremities - no edema, redness or tenderness in the calves or thighs Skin - normal coloration and turgor, no rashes, no suspicious skin lesions noted Routine exam Pap sent yes Mammogram due yes.  Dx of the left breast screening of the right hysterectomy used for  contraception RT 7yr

## 2012-01-03 LAB — PAP IG W/ RFLX HPV ASCU

## 2012-01-13 ENCOUNTER — Ambulatory Visit
Admission: RE | Admit: 2012-01-13 | Discharge: 2012-01-13 | Disposition: A | Discharge status: Home / Self Care | Payer: BC Managed Care – PPO | Source: Ambulatory Visit | Attending: Obstetrics and Gynecology | Admitting: Obstetrics and Gynecology

## 2012-01-13 ENCOUNTER — Other Ambulatory Visit: Payer: Self-pay | Admitting: Obstetrics and Gynecology

## 2012-01-13 DIAGNOSIS — N644 Mastodynia: Secondary | ICD-10-CM

## 2012-01-13 DIAGNOSIS — Z124 Encounter for screening for malignant neoplasm of cervix: Secondary | ICD-10-CM

## 2012-01-23 ENCOUNTER — Ambulatory Visit (INDEPENDENT_AMBULATORY_CARE_PROVIDER_SITE_OTHER): Payer: Self-pay | Admitting: Surgery

## 2012-02-07 ENCOUNTER — Ambulatory Visit (INDEPENDENT_AMBULATORY_CARE_PROVIDER_SITE_OTHER): Payer: Self-pay | Admitting: Surgery

## 2012-02-12 ENCOUNTER — Encounter (INDEPENDENT_AMBULATORY_CARE_PROVIDER_SITE_OTHER): Payer: Self-pay | Admitting: Surgery

## 2012-05-13 ENCOUNTER — Ambulatory Visit: Payer: Self-pay | Admitting: Internal Medicine

## 2013-05-10 ENCOUNTER — Ambulatory Visit: Payer: Self-pay | Admitting: Physician Assistant

## 2013-05-10 LAB — COMPREHENSIVE METABOLIC PANEL
ALBUMIN: 3.3 g/dL — AB (ref 3.4–5.0)
ALK PHOS: 79 U/L
ANION GAP: 7 (ref 7–16)
AST: 16 U/L (ref 15–37)
BILIRUBIN TOTAL: 0.5 mg/dL (ref 0.2–1.0)
BUN: 20 mg/dL — AB (ref 7–18)
CALCIUM: 8.8 mg/dL (ref 8.5–10.1)
CO2: 29 mmol/L (ref 21–32)
CREATININE: 1.21 mg/dL (ref 0.60–1.30)
Chloride: 103 mmol/L (ref 98–107)
EGFR (African American): >60
GFR CALC NON AF AMER: 54 — AB
GLUCOSE: 96 mg/dL (ref 65–99)
Osmolality: 280 (ref 275–301)
POTASSIUM: 4.1 mmol/L (ref 3.5–5.1)
SGPT (ALT): 29 U/L (ref 12–78)
Sodium: 139 mmol/L (ref 136–145)
Total Protein: 8 g/dL (ref 6.4–8.2)

## 2013-05-10 LAB — CBC WITH DIFFERENTIAL/PLATELET
BASOS ABS: 0.1 10*3/uL (ref 0.0–0.1)
BASOS PCT: 1.4 %
EOS ABS: 0.1 10*3/uL (ref 0.0–0.7)
Eosinophil %: 1.3 %
HCT: 38.7 % (ref 35.0–47.0)
HGB: 12.6 g/dL (ref 12.0–16.0)
LYMPHS ABS: 2 10*3/uL (ref 1.0–3.6)
LYMPHS PCT: 21.2 %
MCH: 30.1 pg (ref 26.0–34.0)
MCHC: 32.6 g/dL (ref 32.0–36.0)
MCV: 92 fL (ref 80–100)
MONO ABS: 0.7 x10 3/mm (ref 0.2–0.9)
Monocyte %: 7.5 %
NEUTROS PCT: 68.6 %
Neutrophil #: 6.6 10*3/uL — ABNORMAL HIGH (ref 1.4–6.5)
Platelet: 323 10*3/uL (ref 150–440)
RBC: 4.19 10*6/uL (ref 3.80–5.20)
RDW: 12.9 % (ref 11.5–14.5)
WBC: 9.6 10*3/uL (ref 3.6–11.0)

## 2013-06-06 ENCOUNTER — Ambulatory Visit: Payer: Self-pay | Admitting: Physician Assistant

## 2013-08-28 ENCOUNTER — Ambulatory Visit: Payer: Self-pay | Admitting: Family Medicine

## 2013-10-11 ENCOUNTER — Other Ambulatory Visit: Payer: Self-pay | Admitting: Obstetrics and Gynecology

## 2013-10-11 DIAGNOSIS — Z1231 Encounter for screening mammogram for malignant neoplasm of breast: Secondary | ICD-10-CM

## 2013-10-20 ENCOUNTER — Ambulatory Visit
Admission: RE | Admit: 2013-10-20 | Discharge: 2013-10-20 | Disposition: A | Discharge status: Home / Self Care | Payer: BC Managed Care – PPO | Source: Ambulatory Visit | Attending: Obstetrics and Gynecology | Admitting: Obstetrics and Gynecology

## 2013-10-20 ENCOUNTER — Encounter (INDEPENDENT_AMBULATORY_CARE_PROVIDER_SITE_OTHER): Payer: Self-pay

## 2013-10-20 DIAGNOSIS — Z1231 Encounter for screening mammogram for malignant neoplasm of breast: Secondary | ICD-10-CM

## 2013-11-15 ENCOUNTER — Encounter: Payer: Self-pay | Admitting: Obstetrics and Gynecology

## 2014-04-29 ENCOUNTER — Ambulatory Visit
Admit: 2014-04-29 | Disposition: A | Discharge status: Home / Self Care | Payer: Self-pay | Attending: Nurse Practitioner | Admitting: Nurse Practitioner

## 2014-08-30 ENCOUNTER — Encounter: Payer: Self-pay | Admitting: *Deleted

## 2014-08-31 ENCOUNTER — Encounter
Admission: RE | Disposition: A | Discharge status: Home / Self Care | Payer: Self-pay | Source: Ambulatory Visit | Attending: Unknown Physician Specialty

## 2014-08-31 ENCOUNTER — Ambulatory Visit: Payer: BLUE CROSS/BLUE SHIELD | Admitting: Anesthesiology

## 2014-08-31 ENCOUNTER — Encounter: Payer: Self-pay | Admitting: Anesthesiology

## 2014-08-31 ENCOUNTER — Ambulatory Visit
Admission: RE | Admit: 2014-08-31 | Discharge: 2014-08-31 | Disposition: A | Discharge status: Home / Self Care | Payer: BLUE CROSS/BLUE SHIELD | Source: Ambulatory Visit | Attending: Unknown Physician Specialty | Admitting: Unknown Physician Specialty

## 2014-08-31 DIAGNOSIS — R12 Heartburn: Secondary | ICD-10-CM | POA: Insufficient documentation

## 2014-08-31 DIAGNOSIS — Z1211 Encounter for screening for malignant neoplasm of colon: Secondary | ICD-10-CM | POA: Insufficient documentation

## 2014-08-31 DIAGNOSIS — K5730 Diverticulosis of large intestine without perforation or abscess without bleeding: Secondary | ICD-10-CM | POA: Insufficient documentation

## 2014-08-31 DIAGNOSIS — K2980 Duodenitis without bleeding: Secondary | ICD-10-CM | POA: Insufficient documentation

## 2014-08-31 DIAGNOSIS — E119 Type 2 diabetes mellitus without complications: Secondary | ICD-10-CM | POA: Diagnosis not present

## 2014-08-31 DIAGNOSIS — Z8371 Family history of colonic polyps: Secondary | ICD-10-CM | POA: Diagnosis not present

## 2014-08-31 DIAGNOSIS — K449 Diaphragmatic hernia without obstruction or gangrene: Secondary | ICD-10-CM | POA: Insufficient documentation

## 2014-08-31 DIAGNOSIS — Z79899 Other long term (current) drug therapy: Secondary | ICD-10-CM | POA: Diagnosis not present

## 2014-08-31 DIAGNOSIS — D125 Benign neoplasm of sigmoid colon: Secondary | ICD-10-CM | POA: Insufficient documentation

## 2014-08-31 DIAGNOSIS — R1013 Epigastric pain: Secondary | ICD-10-CM | POA: Insufficient documentation

## 2014-08-31 DIAGNOSIS — K640 First degree hemorrhoids: Secondary | ICD-10-CM | POA: Insufficient documentation

## 2014-08-31 HISTORY — PX: COLONOSCOPY WITH PROPOFOL: SHX5780

## 2014-08-31 HISTORY — DX: Type 2 diabetes mellitus without complications: E11.9

## 2014-08-31 HISTORY — PX: ESOPHAGOGASTRODUODENOSCOPY (EGD) WITH PROPOFOL: SHX5813

## 2014-08-31 LAB — GLUCOSE, CAPILLARY: Glucose-Capillary: 90 mg/dL (ref 65–99)

## 2014-08-31 SURGERY — COLONOSCOPY WITH PROPOFOL
Anesthesia: General

## 2014-08-31 MED ORDER — DEXAMETHASONE SODIUM PHOSPHATE 10 MG/ML IJ SOLN
INTRAMUSCULAR | Status: DC | PRN
  Administered 2014-08-31: 4 mg via INTRAVENOUS

## 2014-08-31 MED ORDER — MIDAZOLAM HCL 2 MG/2ML IJ SOLN
INTRAMUSCULAR | Status: DC | PRN
  Administered 2014-08-31: 2 mg via INTRAVENOUS

## 2014-08-31 MED ORDER — PROPOFOL 10 MG/ML IV BOLUS
INTRAVENOUS | Status: DC | PRN
  Administered 2014-08-31: 50 mg via INTRAVENOUS

## 2014-08-31 MED ORDER — ONDANSETRON HCL 4 MG/2ML IJ SOLN
INTRAMUSCULAR | Status: DC | PRN
  Administered 2014-08-31: 4 mg via INTRAVENOUS

## 2014-08-31 MED ORDER — PROPOFOL INFUSION 10 MG/ML OPTIME
INTRAVENOUS | Status: DC | PRN
  Administered 2014-08-31: 140 ug/kg/min via INTRAVENOUS

## 2014-08-31 MED ORDER — SODIUM CHLORIDE 0.9 % IV SOLN
INTRAVENOUS | Status: DC
  Administered 2014-08-31: 10:00:00 via INTRAVENOUS
  Administered 2014-08-31: 1000 mL via INTRAVENOUS

## 2014-08-31 MED ORDER — SODIUM CHLORIDE 0.9 % IV SOLN
INTRAVENOUS | Status: DC
  Administered 2014-08-31: 10:00:00 via INTRAVENOUS

## 2014-08-31 MED ORDER — LIDOCAINE HCL (CARDIAC) 20 MG/ML IV SOLN
INTRAVENOUS | Status: DC | PRN
  Administered 2014-08-31: 60 mg via INTRAVENOUS

## 2014-08-31 NOTE — Anesthesia Preprocedure Evaluation (Signed)
Anesthesia Evaluation  Patient identified by MRN, date of birth, ID band Patient awake  General Assessment Comment:Patient reports a Hx of Seizures after general anesthetics  History of Anesthesia Complications (+) PROLONGED EMERGENCE and history of anesthetic complications  Airway Mallampati: II  TM Distance: >3 FB Neck ROM: Full    Dental no notable dental hx.    Pulmonary neg pulmonary ROS,  breath sounds clear to auscultation  Pulmonary exam normal       Cardiovascular negative cardio ROS Normal cardiovascular exam    Neuro/Psych Reported seizures per patient after general anesthetics negative psych ROS   GI/Hepatic Neg liver ROS, GERD-  Medicated and Controlled,  Endo/Other  diabetes, Well Controlled, Type 2, Oral Hypoglycemic Agents  Renal/GU negative Renal ROS     Musculoskeletal negative musculoskeletal ROS (+)   Abdominal Normal abdominal exam  (+)   Peds  Hematology negative hematology ROS (+)   Anesthesia Other Findings   Reproductive/Obstetrics                             Anesthesia Physical Anesthesia Plan  ASA: II  Anesthesia Plan: General   Post-op Pain Management:    Induction: Intravenous  Airway Management Planned: Nasal Cannula  Additional Equipment:   Intra-op Plan:   Post-operative Plan:   Informed Consent: I have reviewed the patients History and Physical, chart, labs and discussed the procedure including the risks, benefits and alternatives for the proposed anesthesia with the patient or authorized representative who has indicated his/her understanding and acceptance.   Dental advisory given  Plan Discussed with: CRNA and Surgeon  Anesthesia Plan Comments:         Anesthesia Quick Evaluation

## 2014-08-31 NOTE — H&P (Signed)
   Primary Care Physician:  Pcp Not In System Primary Gastroenterologist:  Dr. Vira Agar  Pre-Procedure History & Physical: HPI:  Madison Armstrong is a 49 y.o. female is here for an endoscopy and colonoscopy.   Past Medical History  Diagnosis Date  . Diabetes mellitus without complication     Past Surgical History  Procedure Laterality Date  . Abdominal hysterectomy    . Nipple removal      Prior to Admission medications   Medication Sig Start Date End Date Taking? Authorizing Provider  meclizine (ANTIVERT) 25 MG tablet Take 25 mg by mouth 3 (three) times daily as needed for dizziness.   Yes Historical Provider, MD  metFORMIN (GLUCOPHAGE) 500 MG tablet Take by mouth 2 (two) times daily with a meal.   Yes Historical Provider, MD  omeprazole (PRILOSEC) 40 MG capsule Take 40 mg by mouth daily.   Yes Historical Provider, MD    Allergies as of 07/27/2014  . (No Known Allergies)    Family History  Problem Relation Age of Onset  . Stroke Mother   . Hypertension Mother   . Hypertension Father   . Ovarian cancer Maternal Grandmother   . Stroke Maternal Grandfather   . Throat cancer Paternal Grandmother   . Heart disease Paternal Grandfather   . Heart disease Brother     Social History   Social History  . Marital Status: Married    Spouse Name: N/A  . Number of Children: N/A  . Years of Education: N/A   Occupational History  . Not on file.   Social History Main Topics  . Smoking status: Never Smoker   . Smokeless tobacco: Never Used  . Alcohol Use: No  . Drug Use: No  . Sexual Activity: Yes    Birth Control/ Protection: Surgical     Comment: HYST    Other Topics Concern  . Not on file   Social History Narrative    Review of Systems: See HPI, otherwise negative ROS  Physical Exam: BP 133/79 mmHg  Pulse 85  Temp(Src) 96.9 F (36.1 C) (Tympanic)  Resp 19  Ht 5\' 10"  (1.778 m)  Wt 104.327 kg (230 lb)  BMI 33.00 kg/m2  SpO2 100% General:   Alert,   pleasant and cooperative in NAD Head:  Normocephalic and atraumatic. Neck:  Supple; no masses or thyromegaly. Lungs:  Clear throughout to auscultation.    Heart:  Regular rate and rhythm. Abdomen:  Soft, nontender and nondistended. Normal bowel sounds, without guarding, and without rebound.   Neurologic:  Alert and  oriented x4;  grossly normal neurologically.  Impression/Plan: Madison Armstrong is here for an endoscopy and colonoscopy to be performed for epigastric pain, FH colon polyps in father  Risks, benefits, limitations, and alternatives regarding  endoscopy and colonoscopy have been reviewed with the patient.  Questions have been answered.  All parties agreeable.   Gaylyn Cheers, MD  08/31/2014, 10:11 AM

## 2014-08-31 NOTE — Op Note (Signed)
[facility] Gastroenterology Patient Name: Madison Armstrong Procedure Date: 08/31/2014 10:14 AM MRN: [id number] Account #: [id number] Date of Birth: [birth year] Admit Type: Outpatient Age: 49 Room: [facility] ENDO ROOM 3 Gender: Female Note Status: Finalized Procedure:         Colonoscopy Indications:       Colon cancer screening in patient at increased risk:                     Family history of colon polyps Providers:         Manya Silvas, MD Referring MD:      No Local Md, MD (Referring MD) Medicines:         Propofol per Anesthesia Complications:     No immediate complications. Procedure:         Pre-Anesthesia Assessment:                    - After reviewing the risks and benefits, the patient was                     deemed in satisfactory condition to undergo the procedure.                    After obtaining informed consent, the colonoscope was                     passed under direct vision. Throughout the procedure, the                     patient's blood pressure, pulse, and oxygen saturations                     were monitored continuously. The Colonoscope was                     introduced through the anus and advanced to the the cecum,                     identified by appendiceal orifice and ileocecal valve. The                     colonoscopy was performed without difficulty. The patient                     tolerated the procedure well. The quality of the bowel                     preparation was good. Findings:      A diminutive polyp was found in the sigmoid colon. The polyp was       sessile. The polyp was removed with a jumbo cold forceps. Resection and       retrieval were complete.      A few small-mouthed diverticula were found in the sigmoid colon.      Internal hemorrhoids were found during endoscopy. The hemorrhoids were       small and Grade I (internal hemorrhoids that do not prolapse). Impression:        - One diminutive polyp in the  sigmoid colon. Resected and                     retrieved.                    - Diverticulosis in the sigmoid  colon.                    - Internal hemorrhoids. Recommendation:    - Await pathology results. Manya Silvas, MD 08/31/2014 10:49:47 AM This report has been signed electronically. Number of Addenda: 0 Note Initiated On: 08/31/2014 10:14 AM Scope Withdrawal Time: 0 hours 8 minutes 10 seconds  Total Procedure Duration: 0 hours 11 minutes 48 seconds       [facility]

## 2014-08-31 NOTE — Transfer of Care (Signed)
Immediate Anesthesia Transfer of Care Note  Patient: Madison Armstrong  Procedure(s) Performed: Procedure(s): COLONOSCOPY WITH PROPOFOL (N/A) ESOPHAGOGASTRODUODENOSCOPY (EGD) WITH PROPOFOL (N/A)  Patient Location: PACU and Endoscopy Unit  Anesthesia Type:General  Level of Consciousness: awake, alert , oriented and patient cooperative  Airway & Oxygen Therapy: Patient Spontanous Breathing and Patient connected to nasal cannula oxygen  Post-op Assessment: Report given to RN, Post -op Vital signs reviewed and stable and Patient moving all extremities X 4  Post vital signs: Reviewed and stable  Last Vitals:  Filed Vitals:   08/31/14 1053  BP: 119/75  Pulse: 94  Temp: 35.9 C  Resp: 19    Complications: No apparent anesthesia complications

## 2014-08-31 NOTE — Op Note (Signed)
[facility] Gastroenterology Patient Name: Madison Armstrong Procedure Date: 08/31/2014 10:14 AM MRN: [id number] Account #: [id number] Date of Birth: May 03, 1965 Admit Type: Outpatient Age: 49 Room: [facility] ENDO ROOM 3 Gender: Female Note Status: Finalized Procedure:         Upper GI endoscopy Indications:       Epigastric abdominal pain, Heartburn Providers:         Manya Silvas, MD Referring MD:      No Local Md, MD (Referring MD) Medicines:         Propofol per Anesthesia Complications:     No immediate complications. Procedure:         Pre-Anesthesia Assessment:                    - After reviewing the risks and benefits, the patient was                     deemed in satisfactory condition to undergo the procedure.                    After obtaining informed consent, the endoscope was passed                     under direct vision. Throughout the procedure, the                     patient's blood pressure, pulse, and oxygen saturations                     were monitored continuously. The Olympus GIF-160 endoscope                     (S#. [id number]) was introduced through the mouth, and                     advanced to the second part of duodenum. The upper GI                     endoscopy was accomplished without difficulty. The patient                     tolerated the procedure well. Findings:      The examined esophagus was normal. GEJ 39cm      A small hiatus hernia was present. 1-2 cm. in length      The stomach was otherwise normal.      Localized small area of granular mucosa was found in the duodenal bulb.      The 2nd part of the duodenum was normal. Impression:        - Normal esophagus.                    - Small hiatus hernia.                    - Normal stomach.                    - Granular mucosa in the duodenal bulb.                    - Normal 2nd part of the duodenum.                    - No specimens collected. Recommendation:    - Perform a  colonoscopy  as previously scheduled. Manya Silvas, MD 08/31/2014 10:33:25 AM This report has been signed electronically. Number of Addenda: 0 Note Initiated On: 08/31/2014 10:14 AM      [facility]

## 2014-08-31 NOTE — Anesthesia Postprocedure Evaluation (Signed)
  Anesthesia Post-op Note  Patient: Madison Armstrong  Procedure(s) Performed: Procedure(s): COLONOSCOPY WITH PROPOFOL (N/A) ESOPHAGOGASTRODUODENOSCOPY (EGD) WITH PROPOFOL (N/A)  Anesthesia type:General  Patient location: PACU  Post pain: Pain level controlled  Post assessment: Post-op Vital signs reviewed, Patient's Cardiovascular Status Stable, Respiratory Function Stable, Patent Airway and No signs of Nausea or vomiting  Post vital signs: Reviewed and stable  Last Vitals:  Filed Vitals:   08/31/14 1130  BP: 116/75  Pulse: 71  Temp:   Resp: 14    Level of consciousness: awake, alert  and patient cooperative  Complications: No apparent anesthesia complications

## 2014-09-01 LAB — SURGICAL PATHOLOGY

## 2014-09-02 ENCOUNTER — Encounter: Payer: Self-pay | Admitting: Unknown Physician Specialty

## 2015-03-01 DIAGNOSIS — E119 Type 2 diabetes mellitus without complications: Secondary | ICD-10-CM | POA: Insufficient documentation

## 2015-09-12 ENCOUNTER — Ambulatory Visit
Admission: EM | Admit: 2015-09-12 | Discharge: 2015-09-12 | Disposition: A | Discharge status: Home / Self Care | Payer: BLUE CROSS/BLUE SHIELD | Attending: Family Medicine | Admitting: Family Medicine

## 2015-09-12 ENCOUNTER — Encounter: Payer: Self-pay | Admitting: *Deleted

## 2015-09-12 DIAGNOSIS — R42 Dizziness and giddiness: Secondary | ICD-10-CM

## 2015-09-12 DIAGNOSIS — R5382 Chronic fatigue, unspecified: Secondary | ICD-10-CM | POA: Diagnosis not present

## 2015-09-12 DIAGNOSIS — R1084 Generalized abdominal pain: Secondary | ICD-10-CM

## 2015-09-12 LAB — CBC WITH DIFFERENTIAL/PLATELET
Basophils Absolute: 0 10*3/uL (ref 0–0.1)
Basophils Relative: 0 %
Eosinophils Absolute: 0.1 10*3/uL (ref 0–0.7)
Eosinophils Relative: 1 %
HCT: 37.6 % (ref 35.0–47.0)
Hemoglobin: 12.5 g/dL (ref 12.0–16.0)
Lymphocytes Relative: 23 %
Lymphs Abs: 2.1 10*3/uL (ref 1.0–3.6)
MCH: 30.7 pg (ref 26.0–34.0)
MCHC: 33.2 g/dL (ref 32.0–36.0)
MCV: 92.6 fL (ref 80.0–100.0)
Monocytes Absolute: 0.4 10*3/uL (ref 0.2–0.9)
Monocytes Relative: 5 %
Neutro Abs: 6.5 10*3/uL (ref 1.4–6.5)
Neutrophils Relative %: 71 %
Platelets: 351 10*3/uL (ref 150–440)
RBC: 4.06 MIL/uL (ref 3.80–5.20)
RDW: 12.9 % (ref 11.5–14.5)
WBC: 9.1 10*3/uL (ref 3.6–11.0)

## 2015-09-12 LAB — URINALYSIS COMPLETE WITH MICROSCOPIC (ARMC ONLY)
Bilirubin Urine: NEGATIVE
Glucose, UA: NEGATIVE mg/dL
Ketones, ur: NEGATIVE mg/dL
Leukocytes, UA: NEGATIVE
Nitrite: NEGATIVE
Protein, ur: NEGATIVE mg/dL
Specific Gravity, Urine: 1.02 (ref 1.005–1.030)
WBC, UA: NONE SEEN WBC/hpf (ref 0–5)
pH: 6 (ref 5.0–8.0)

## 2015-09-12 LAB — COMPREHENSIVE METABOLIC PANEL
ALT: 16 U/L (ref 14–54)
AST: 15 U/L (ref 15–41)
Albumin: 3.7 g/dL (ref 3.5–5.0)
Alkaline Phosphatase: 71 U/L (ref 38–126)
Anion gap: 5 (ref 5–15)
BUN: 10 mg/dL (ref 6–20)
CO2: 29 mmol/L (ref 22–32)
Calcium: 8.7 mg/dL — ABNORMAL LOW (ref 8.9–10.3)
Chloride: 104 mmol/L (ref 101–111)
Creatinine, Ser: 0.97 mg/dL (ref 0.44–1.00)
GFR calc Af Amer: >60 mL/min (ref >60)
GFR calc non Af Amer: >60 mL/min (ref >60)
Glucose, Bld: 111 mg/dL — ABNORMAL HIGH (ref 65–99)
Potassium: 3.6 mmol/L (ref 3.5–5.1)
Sodium: 138 mmol/L (ref 135–145)
Total Bilirubin: 0.6 mg/dL (ref 0.3–1.2)
Total Protein: 7.9 g/dL (ref 6.5–8.1)

## 2015-09-12 LAB — LIPASE, BLOOD: Lipase: 25 U/L (ref 11–51)

## 2015-09-12 LAB — TSH: TSH: 3.061 u[IU]/mL (ref 0.350–4.500)

## 2015-09-12 LAB — GLUCOSE, CAPILLARY: GLUCOSE-CAPILLARY: 129 mg/dL — AB (ref 65–99)

## 2015-09-12 NOTE — ED Triage Notes (Signed)
Pt seen at [facility] 3 weeks ago and tx for UTI. Pt had abd pain and similar symptoms then but has worsened since with onset of polydipsia, polyuria, weakness, blurred vision.

## 2015-09-12 NOTE — ED Provider Notes (Signed)
CSN: [id number]     Arrival date & time 09/12/15  1201 History   First MD Initiated Contact with Patient 09/12/15 1247     Chief Complaint  Patient presents with  . Abdominal Pain  . Weakness  . Dizziness  . Fatigue  . Polydipsia  . Urinary Frequency   (Consider location/radiation/quality/duration/timing/severity/associated sxs/prior Treatment) HPI  50 year old female who presents with complaints sleepiness vague stomach pain weakness urinary frequency polydipsia and blurred vision. Patient has a history of diabetes was treated with metformin cause of an elevated A1c but on return was found to be normal and so they discontinued the metformin. She had a endoscopy and a colonoscopy recently by Dr. Vira Agar which was found to be normal. He thought that her epigastric discomfort was on the basis of GERD he gave her medications but she has not been taking it. 3 weeks ago she went to [facility] the same symptoms they thought she had a UTI treated her with antibiotics and she states that she did improve somewhat but the symptoms have since returned. Denies any diarrhea has had no nausea or vomiting. She states that she has no appetite but craves sugars in the form of candy Does not have a primary care physician since hers has changed practice. She has been utilizing her OB/GYN as her primary care        Past Medical History:  Diagnosis Date  . Diabetes mellitus without complication [facility])    Past Surgical History:  Procedure Laterality Date  . ABDOMINAL HYSTERECTOMY    . COLONOSCOPY WITH PROPOFOL N/A 08/31/2014   Procedure: COLONOSCOPY WITH PROPOFOL;  Surgeon: Manya Silvas, MD;  Location: [facility] ENDOSCOPY;  Service: Endoscopy;  Laterality: N/A;  . ESOPHAGOGASTRODUODENOSCOPY (EGD) WITH PROPOFOL N/A 08/31/2014   Procedure: ESOPHAGOGASTRODUODENOSCOPY (EGD) WITH PROPOFOL;  Surgeon: Manya Silvas, MD;  Location: [facility] ENDOSCOPY;  Service: Endoscopy;  Laterality: N/A;  . nipple removal      Family History  Problem Relation Age of Onset  . Stroke Mother   . Hypertension Mother   . Hypertension Father   . Ovarian cancer Maternal Grandmother   . Stroke Maternal Grandfather   . Throat cancer Paternal Grandmother   . Heart disease Paternal Grandfather   . Heart disease Brother    Social History  Substance Use Topics  . Smoking status: Never Smoker  . Smokeless tobacco: Never Used  . Alcohol use No   OB History    Gravida Para Term Preterm AB Living   2 2 2     2    SAB TAB Ectopic Multiple Live Births           2     Review of Systems  Constitutional: Positive for activity change, appetite change and fatigue. Negative for chills and fever.  Gastrointestinal: Positive for abdominal pain. Negative for constipation, diarrhea, nausea and vomiting.  Genitourinary: Positive for frequency.  All other systems reviewed and are negative.   Allergies  Review of patient's allergies indicates no known allergies.  Home Medications   Prior to Admission medications   Medication Sig Start Date End Date Taking? Authorizing Provider  meclizine (ANTIVERT) 25 MG tablet Take 25 mg by mouth 3 (three) times daily as needed for dizziness.    Historical Provider, MD  metFORMIN (GLUCOPHAGE) 500 MG tablet Take by mouth 2 (two) times daily with a meal.    Historical Provider, MD  omeprazole (PRILOSEC) 40 MG capsule Take 40 mg by mouth daily.    Historical  Provider, MD   Meds Ordered and Administered this Visit  Medications - No data to display  BP 128/74 (BP Location: Left Arm)   Pulse 73   Temp 97.7 F (36.5 C) (Oral)   Resp 16   Ht 5\' 10"  (1.778 m)   Wt 234 lb (106.1 kg)   SpO2 100%   BMI 33.58 kg/m  Orthostatic VS for the past 24 hrs:  BP- Lying Pulse- Lying BP- Sitting Pulse- Sitting BP- Standing at 0 minutes Pulse- Standing at 0 minutes  09/12/15 1253 135/71 71 133/77 70 131/73 85    Physical Exam  Constitutional: She is oriented to person, place, and time. She  appears well-developed and well-nourished. No distress.  HENT:  Head: Normocephalic and atraumatic.  Right Ear: External ear normal.  Left Ear: External ear normal.  Nose: Nose normal.  Mouth/Throat: Oropharynx is clear and moist. No oropharyngeal exudate.  Eyes: EOM are normal. Pupils are equal, round, and reactive to light. Right eye exhibits no discharge. Left eye exhibits no discharge.  Neck: Normal range of motion. Neck supple.  Pulmonary/Chest: Effort normal and breath sounds normal. No respiratory distress. She has no wheezes. She has no rales.  Abdominal: Soft. Bowel sounds are normal. She exhibits no distension and no mass. There is tenderness. There is no rebound and no guarding.  The patient has  a diffuse tenderness in the left and right abdomen just superior to the umbilicus. Is no guarding there is no tenderness and there is no distention seen.  Musculoskeletal: Normal range of motion. She exhibits no edema, tenderness or deformity.  Lymphadenopathy:    She has no cervical adenopathy.  Neurological: She is alert and oriented to person, place, and time.  Skin: Skin is warm and dry. She is not diaphoretic.  Psychiatric: She has a normal mood and affect. Her behavior is normal. Judgment and thought content normal.  Nursing note and vitals reviewed.   Urgent Care Course   Clinical Course    Procedures (including critical care time)  Labs Review Labs Reviewed  GLUCOSE, CAPILLARY - Abnormal; Notable for the following:       Result Value   Glucose-Capillary 129 (*)    All other components within normal limits  COMPREHENSIVE METABOLIC PANEL - Abnormal; Notable for the following:    Glucose, Bld 111 (*)    Calcium 8.7 (*)    All other components within normal limits  URINALYSIS COMPLETEWITH MICROSCOPIC ([facility] ONLY) - Abnormal; Notable for the following:    APPearance HAZY (*)    Hgb urine dipstick TRACE (*)    Bacteria, UA MANY (*)    Squamous Epithelial / LPF 0-5 (*)     All other components within normal limits  CBC WITH DIFFERENTIAL/PLATELET  LIPASE, BLOOD  TSH    Imaging Review No results found.   Visual Acuity Review  Right Eye Distance: 20/30 Left Eye Distance: 20/40 Bilateral Distance: 20/25  Right Eye Near:   Left Eye Near:    Bilateral Near:         MDM   1. Chronic fatigue   2. Generalized abdominal pain   3. Dizziness    Discharge Medication List as of 09/12/2015  2:50 PM    Plan: 1. Test/x-ray results and diagnosis reviewed with patient 2. rx as per orders; risks, benefits, potential side effects reviewed with patient 3. Recommend supportive treatment with adequate hydration. Recommended that she follow-up with her primary care had given her the name of Dr. Lacinda Axon.  I have obtained a TSH to rule out hypothyroidism as a reason for her complaints and that should be available tomorrow. Told her though she needs to have a primary care that can perform a more in-depth evaluation and workup. Does not appear to be diabetes based on her findings today. Her blood sugars were slightly elevated but she has been chewing on candy she's been in the room. I reviewed all the laboratory with her and told him it is normal today. He'll make an appointment with Dr. Lacinda Axon tomorrow 4. F/u prn if symptoms worsen or don't improve     Lorin Picket, PA-C 09/12/15 1506

## 2015-09-13 ENCOUNTER — Ambulatory Visit (INDEPENDENT_AMBULATORY_CARE_PROVIDER_SITE_OTHER): Payer: BLUE CROSS/BLUE SHIELD | Admitting: Family Medicine

## 2015-09-13 ENCOUNTER — Encounter: Payer: Self-pay | Admitting: Family Medicine

## 2015-09-13 VITALS — BP 124/80 | HR 87 | Temp 98.0°F | Ht 69.0 in | Wt 237.4 lb

## 2015-09-13 DIAGNOSIS — R6889 Other general symptoms and signs: Secondary | ICD-10-CM | POA: Diagnosis not present

## 2015-09-13 DIAGNOSIS — G4719 Other hypersomnia: Secondary | ICD-10-CM | POA: Diagnosis not present

## 2015-09-13 DIAGNOSIS — R5382 Chronic fatigue, unspecified: Secondary | ICD-10-CM

## 2015-09-13 DIAGNOSIS — Z23 Encounter for immunization: Secondary | ICD-10-CM | POA: Diagnosis not present

## 2015-09-13 NOTE — Assessment & Plan Note (Addendum)
New problem. Patient with multiple somatic complaints. Unclear etiology/prognosis at this time. Exam unremarkable. Unclear etiology for her symptoms at this time. I cannot connect them all to a single underlying etiology. Suggested sleep study for evaluation of excessive daytime sleepiness. Patient in agreement. Will order. Obtaining additional laboratory studies today for workup of other symptoms: Urine osmolality, plasma osmolality, A1c. Also obtaining [place]. Advised well-balanced diet and regular exercise to achieve weight loss. Lastly, advised her to get her eyes checked by an ophthalmologist.

## 2015-09-13 NOTE — Progress Notes (Signed)
Subjective:  Patient ID: Madison Armstrong, female    DOB: 01/04/1966  Age: 50 y.o. MRN: [id number]  CC: Daytime sleepiness, dizziness, vision changes, increased thirst, urinary frequency  HPI Madison Armstrong is a 50 y.o. female presents to the clinic today with several complaints.  Patient presents with several complaints today. She was seen yesterday at urgent care with similar complaints. Laboratory studies were obtained and were normal. She was instructed to establish with a primary care physician. She presents today for evaluation.  Patient states that she has been having issues for the past year. She states her symptoms have slowly been getting worse. She has been experiencing excessive daytime sleepiness. This is been worse over the past 3 weeks. She also reports blurry vision intermittently. She had an unremarkable visual exam at urgent care yesterday. She endorses excessive thirst and polyuria. She also reports intermittent dizziness. She describes the dizziness as dealing "off balance". Seems to occur with positional changes. She has taken meclizine without significant improvement. No other complaints at this time.  PMH, Surgical Hx, Family Hx, Social History reviewed and updated as below.  Past Medical History:  Diagnosis Date  . Chicken pox   . Diabetes mellitus without complication [facility])    Past Surgical History:  Procedure Laterality Date  . ABDOMINAL HYSTERECTOMY    . COLONOSCOPY WITH PROPOFOL N/A 08/31/2014   Procedure: COLONOSCOPY WITH PROPOFOL;  Surgeon: Manya Silvas, MD;  Location: [facility] ENDOSCOPY;  Service: Endoscopy;  Laterality: N/A;  . ESOPHAGOGASTRODUODENOSCOPY (EGD) WITH PROPOFOL N/A 08/31/2014   Procedure: ESOPHAGOGASTRODUODENOSCOPY (EGD) WITH PROPOFOL;  Surgeon: Manya Silvas, MD;  Location: [facility] ENDOSCOPY;  Service: Endoscopy;  Laterality: N/A;  . nipple removal     Family History  Problem Relation Age of Onset  . Stroke Mother   . Hypertension Mother    . Hypertension Father   . Ovarian cancer Maternal Grandmother   . Stroke Maternal Grandfather   . Throat cancer Paternal Grandmother   . Heart disease Paternal Grandfather   . Heart disease Brother     Passed away at 76   Social History  Substance Use Topics  . Smoking status: Never Smoker  . Smokeless tobacco: Never Used  . Alcohol use No    Review of Systems  Eyes: Positive for visual disturbance.  Endocrine: Positive for heat intolerance, polydipsia and polyuria.  Neurological: Positive for dizziness, weakness, numbness and headaches.  All other systems reviewed and are negative.  Objective:   Today's Vitals: BP 124/80 (BP Location: Right Arm, Patient Position: Sitting, Cuff Size: Large)   Pulse 87   Temp 98 F (36.7 C) (Oral)   Ht 5\' 9"  (1.753 m)   Wt 237 lb 6 oz (107.7 kg)   SpO2 98%   BMI 35.05 kg/m   Physical Exam  Constitutional: She is oriented to person, place, and time. She appears well-developed. No distress.  HENT:  Mouth/Throat: Oropharynx is clear and moist.  Eyes: Conjunctivae are normal. Pupils are equal, round, and reactive to light. No scleral icterus.  Neck: Neck supple.  Cardiovascular: Normal rate and regular rhythm.   Pulmonary/Chest: Effort normal. She has no wheezes. She has no rales.  Abdominal: Soft. She exhibits no distension. There is no tenderness. There is no rebound and no guarding.  Musculoskeletal: Normal range of motion. She exhibits no edema.  Lymphadenopathy:    She has no cervical adenopathy.  Neurological: She is alert and oriented to person, place, and time.  Skin: Skin is  warm and dry. No rash noted.  Psychiatric:  Flat affect.   Vitals reviewed.  Assessment & Plan:   Problem List Items Addressed This Visit    Multiple somatic complaints    New problem. Patient with multiple somatic complaints. Unclear etiology/prognosis at this time. Exam unremarkable. Unclear etiology for her symptoms at this time. I cannot  connect them all to a single underlying etiology. Suggested sleep study for evaluation of excessive daytime sleepiness. Patient in agreement. Will order. Obtaining additional laboratory studies today for workup of other symptoms: Urine osmolality, plasma osmolality, A1c. Also obtaining [place]. Advised well-balanced diet and regular exercise to achieve weight loss. Lastly, advised her to get her eyes checked by an ophthalmologist.      Relevant Orders   Osmolality, urine   Osmolality   Hemoglobin A1C   FSH    Other Visit Diagnoses    Excessive daytime sleepiness    -  Primary   Relevant Orders   Split night study      Outpatient Encounter Prescriptions as of 09/13/2015  Medication Sig  . meclizine (ANTIVERT) 25 MG tablet Take 25 mg by mouth 3 (three) times daily as needed for dizziness.  Marland Kitchen omeprazole (PRILOSEC) 40 MG capsule Take 40 mg by mouth daily.  . [DISCONTINUED] metFORMIN (GLUCOPHAGE) 500 MG tablet Take by mouth 2 (two) times daily with a meal.   No facility-administered encounter medications on file as of 09/13/2015.     Follow-up: 1-3 months.  Lilly

## 2015-09-13 NOTE — Progress Notes (Signed)
Pre visit review using our clinic review tool, if applicable. No additional management support is needed unless otherwise documented below in the visit note. 

## 2015-09-13 NOTE — Patient Instructions (Addendum)
We will call with your lab results.  Consider the sleep study.  Get your eyes checked.  Try and watch your diet and exercise regularly.  Follow up with me in 1-3 months.  Take care  Dr. Lacinda Axon

## 2015-09-14 LAB — HEMOGLOBIN A1C: Hgb A1c MFr Bld: 5.7 % (ref 4.6–6.5)

## 2015-09-14 LAB — OSMOLALITY: Osmolality: 293 mOsm/kg (ref 278–305)

## 2015-09-14 LAB — FOLLICLE STIMULATING HORMONE: FSH: 13.8 m[IU]/mL

## 2015-09-14 LAB — OSMOLALITY, URINE: OSMOLALITY UR: 1063 mosm/kg (ref 50–1200)

## 2015-10-19 ENCOUNTER — Telehealth: Payer: Self-pay | Admitting: Family Medicine

## 2015-10-19 NOTE — Telephone Encounter (Signed)
Labs printed and placed up front.

## 2015-10-19 NOTE — Telephone Encounter (Signed)
Pt would like a copy of her lab results. She would like to pick up today.

## 2015-11-13 ENCOUNTER — Ambulatory Visit: Payer: BLUE CROSS/BLUE SHIELD | Admitting: Family Medicine

## 2015-11-13 DIAGNOSIS — Z0289 Encounter for other administrative examinations: Secondary | ICD-10-CM

## 2016-02-07 DIAGNOSIS — R079 Chest pain, unspecified: Secondary | ICD-10-CM | POA: Insufficient documentation

## 2016-02-15 DIAGNOSIS — E119 Type 2 diabetes mellitus without complications: Secondary | ICD-10-CM | POA: Insufficient documentation

## 2016-02-15 DIAGNOSIS — E6609 Other obesity due to excess calories: Secondary | ICD-10-CM | POA: Insufficient documentation

## 2016-02-15 DIAGNOSIS — J309 Allergic rhinitis, unspecified: Secondary | ICD-10-CM | POA: Insufficient documentation

## 2016-02-19 ENCOUNTER — Institutional Professional Consult (permissible substitution): Payer: BLUE CROSS/BLUE SHIELD | Admitting: Neurology

## 2016-11-25 DIAGNOSIS — S92919A Unspecified fracture of unspecified toe(s), initial encounter for closed fracture: Secondary | ICD-10-CM | POA: Insufficient documentation

## 2016-11-25 DIAGNOSIS — N83209 Unspecified ovarian cyst, unspecified side: Secondary | ICD-10-CM | POA: Insufficient documentation

## 2017-02-17 DIAGNOSIS — Z0000 Encounter for general adult medical examination without abnormal findings: Secondary | ICD-10-CM | POA: Insufficient documentation

## 2017-02-17 DIAGNOSIS — E785 Hyperlipidemia, unspecified: Secondary | ICD-10-CM | POA: Insufficient documentation

## 2017-02-17 DIAGNOSIS — R42 Dizziness and giddiness: Secondary | ICD-10-CM | POA: Insufficient documentation

## 2017-02-17 DIAGNOSIS — G8929 Other chronic pain: Secondary | ICD-10-CM | POA: Insufficient documentation

## 2017-07-03 DIAGNOSIS — M79605 Pain in left leg: Secondary | ICD-10-CM | POA: Insufficient documentation

## 2017-07-03 DIAGNOSIS — M79604 Pain in right leg: Secondary | ICD-10-CM | POA: Insufficient documentation

## 2017-08-27 ENCOUNTER — Emergency Department
Admission: EM | Admit: 2017-08-27 | Discharge: 2017-08-27 | Disposition: A | Discharge status: Home / Self Care | Payer: BLUE CROSS/BLUE SHIELD | Attending: Emergency Medicine | Admitting: Emergency Medicine

## 2017-08-27 ENCOUNTER — Other Ambulatory Visit: Payer: Self-pay

## 2017-08-27 ENCOUNTER — Encounter: Payer: Self-pay | Admitting: Emergency Medicine

## 2017-08-27 ENCOUNTER — Emergency Department: Payer: BLUE CROSS/BLUE SHIELD

## 2017-08-27 DIAGNOSIS — R51 Headache: Secondary | ICD-10-CM | POA: Diagnosis not present

## 2017-08-27 DIAGNOSIS — E119 Type 2 diabetes mellitus without complications: Secondary | ICD-10-CM | POA: Insufficient documentation

## 2017-08-27 DIAGNOSIS — Z79899 Other long term (current) drug therapy: Secondary | ICD-10-CM | POA: Diagnosis not present

## 2017-08-27 DIAGNOSIS — R519 Headache, unspecified: Secondary | ICD-10-CM

## 2017-08-27 MED ORDER — CYCLOBENZAPRINE HCL 5 MG PO TABS: 5.0000 mg | ORAL_TABLET | Freq: Three times a day (TID) | ORAL | 0 refills | Status: DC | PRN

## 2017-08-27 MED ORDER — CYCLOBENZAPRINE HCL 10 MG PO TABS
5.0000 mg | ORAL_TABLET | Freq: Once | ORAL | Status: AC
  Administered 2017-08-27: 5 mg via ORAL

## 2017-08-27 MED ORDER — BUTALBITAL-APAP-CAFFEINE 50-325-40 MG PO TABS
1.0000 | ORAL_TABLET | Freq: Four times a day (QID) | ORAL | 0 refills | Status: AC | PRN
Start: 2017-08-27 — End: 2018-08-27

## 2017-08-27 MED ORDER — ONDANSETRON 4 MG PO TBDP: 4.0000 mg | ORAL_TABLET | Freq: Three times a day (TID) | ORAL | 0 refills | Status: DC | PRN

## 2017-08-27 MED ORDER — LIDOCAINE HCL (PF) 1 % IJ SOLN
INTRAMUSCULAR | Status: AC
  Administered 2017-08-27: 5 mL
  Filled 2017-08-27: qty 5

## 2017-08-27 MED ORDER — BUTALBITAL-APAP-CAFFEINE 50-325-40 MG PO TABS
ORAL_TABLET | ORAL | Status: AC
  Administered 2017-08-27: 1 via ORAL
  Filled 2017-08-27: qty 1

## 2017-08-27 MED ORDER — BUPIVACAINE HCL 0.5 % IJ SOLN
5.0000 mL | Freq: Once | INTRAMUSCULAR | Status: AC
  Administered 2017-08-27: 5 mL
  Filled 2017-08-27: qty 5

## 2017-08-27 MED ORDER — BUPIVACAINE HCL (PF) 0.5 % IJ SOLN
INTRAMUSCULAR | Status: AC
  Administered 2017-08-27: 5 mL
  Filled 2017-08-27: qty 30

## 2017-08-27 MED ORDER — BUTALBITAL-APAP-CAFFEINE 50-325-40 MG PO TABS
1.0000 | ORAL_TABLET | Freq: Once | ORAL | Status: AC
  Administered 2017-08-27: 1 via ORAL

## 2017-08-27 MED ORDER — LIDOCAINE HCL (PF) 1 % IJ SOLN
5.0000 mL | Freq: Once | INTRAMUSCULAR | Status: AC
  Administered 2017-08-27: 5 mL
  Filled 2017-08-27: qty 5

## 2017-08-27 MED ORDER — CYCLOBENZAPRINE HCL 10 MG PO TABS
ORAL_TABLET | ORAL | Status: AC
  Administered 2017-08-27: 5 mg via ORAL
  Filled 2017-08-27: qty 1

## 2017-08-27 NOTE — ED Provider Notes (Signed)
[facility] Emergency Department Provider Note  ____________________________________________  Time seen: Approximately 3:31 PM  I have reviewed the triage vital signs and the nursing notes.   HISTORY  Chief Complaint Headache   HPI Madison Armstrong is a 52 y.o. female with a history of diabetes who presents for evaluation of a headache.  Patient reports that she woke up with this headache 7 days ago.  Initially mild. Has been intermittent for a week. Sometimes mild and sometimes severe. The headache goes away with ibuprofen but returns.  She describes the headache as tightness and pressure located in one specific point overlying the right occipital nerve.  Even the skin over it is very tender to palpation.  She denies thunderclap headache.  Currently the pain is severe.  No facial droop, slurred speech, changes in vision, numbness or paresthesias or weakness of her extremities, no neck stiffness, no fever.  Patient reports history of migraine headaches however those are usually involving one entire side of her head in different than this one. Patient has a h/o 31mm R carotid artery aneurysm.  Past Medical History:  Diagnosis Date  . Chicken pox   . Diabetes mellitus without complication [facility])     Patient Active Problem List   Diagnosis Date Noted  . Multiple somatic complaints 09/13/2015    Past Surgical History:  Procedure Laterality Date  . ABDOMINAL HYSTERECTOMY    . COLONOSCOPY WITH PROPOFOL N/A 08/31/2014   Procedure: COLONOSCOPY WITH PROPOFOL;  Surgeon: Manya Silvas, MD;  Location: [facility] ENDOSCOPY;  Service: Endoscopy;  Laterality: N/A;  . ESOPHAGOGASTRODUODENOSCOPY (EGD) WITH PROPOFOL N/A 08/31/2014   Procedure: ESOPHAGOGASTRODUODENOSCOPY (EGD) WITH PROPOFOL;  Surgeon: Manya Silvas, MD;  Location: [facility] ENDOSCOPY;  Service: Endoscopy;  Laterality: N/A;  . nipple removal      Prior to Admission medications   Medication Sig Start Date End Date  Taking? Authorizing Provider  butalbital-acetaminophen-caffeine (FIORICET, ESGIC) [phone] MG tablet Take 1-2 tablets by mouth every 6 (six) hours as needed for headache. 08/27/17 08/27/18  Alfred Levins, [place], MD  cyclobenzaprine (FLEXERIL) 5 MG tablet Take 1 tablet (5 mg total) by mouth 3 (three) times daily as needed for muscle spasms. 08/27/17   Rudene Re, MD  meclizine (ANTIVERT) 25 MG tablet Take 25 mg by mouth 3 (three) times daily as needed for dizziness.    [provider]  omeprazole (PRILOSEC) 40 MG capsule Take 40 mg by mouth daily.    [provider]  ondansetron (ZOFRAN ODT) 4 MG disintegrating tablet Take 1 tablet (4 mg total) by mouth every 8 (eight) hours as needed for nausea or vomiting. 08/27/17   Rudene Re, MD    Allergies Patient has no known allergies.  Family History  Problem Relation Age of Onset  . Stroke Mother   . Hypertension Mother   . Hypertension Father   . Ovarian cancer Maternal Grandmother   . Stroke Maternal Grandfather   . Throat cancer Paternal Grandmother   . Heart disease Paternal Grandfather   . Heart disease Brother        Passed away at 72    Social History Social History   Tobacco Use  . Smoking status: Never Smoker  . Smokeless tobacco: Never Used  Substance Use Topics  . Alcohol use: No  . Drug use: No    Review of Systems  Constitutional: Negative for fever. Eyes: Negative for visual changes. ENT: Negative for sore throat. Neck: No neck pain  Cardiovascular: Negative for  chest pain. Respiratory: Negative for shortness of breath. Gastrointestinal: Negative for abdominal pain, vomiting or diarrhea. Genitourinary: Negative for dysuria. Musculoskeletal: Negative for back pain. Skin: Negative for rash. Neurological: Negative for weakness or numbness. + HA Psych: No SI or HI  ____________________________________________   PHYSICAL EXAM:  VITAL SIGNS: ED Triage Vitals  Enc Vitals Group      BP 08/27/17 1332 119/80     Pulse Rate 08/27/17 1332 89     Resp 08/27/17 1332 20     Temp 08/27/17 1332 98.1 F (36.7 C)     Temp Source 08/27/17 1332 Oral     SpO2 08/27/17 1332 100 %     Weight 08/27/17 1337 238 lb (108 kg)     Height 08/27/17 1337 5\' 10"  (1.778 m)     Head Circumference --      Peak Flow --      Pain Score 08/27/17 1337 8     Pain Loc --      Pain Edu? --      Excl. in [place]? --     Constitutional: Alert and oriented. Well appearing and in no apparent distress. HEENT:      Head: Normocephalic and atraumatic.         Eyes: Conjunctivae are normal. Sclera is non-icteric.       Mouth/Throat: Mucous membranes are moist.       Neck: Supple with no signs of meningismus. Patient is tender to palpation over the R occipital nerve. NO bruits. Cardiovascular: Regular rate and rhythm. No murmurs, gallops, or rubs. 2+ symmetrical distal pulses are present in all extremities. No JVD. Respiratory: Normal respiratory effort. Lungs are clear to auscultation bilaterally. No wheezes, crackles, or rhonchi.  Gastrointestinal: Soft, non tender, and non distended with positive bowel sounds. No rebound or guarding. Musculoskeletal: Nontender with normal range of motion in all extremities. No edema, cyanosis, or erythema of extremities. Neurologic: Normal speech and language. A & O x3, PERRL, EOMI, no nystagmus, CN II-XII intact, motor testing reveals good tone and bulk throughout. There is no evidence of pronator drift or dysmetria. Muscle strength is 5/5 throughout.  Sensory examination is intact. Gait is normal. Skin: Skin is warm, dry and intact. No rash noted. Psychiatric: Mood and affect are normal. Speech and behavior are normal.  ____________________________________________   LABS (all labs ordered are listed, but only abnormal results are displayed)  Labs Reviewed - No data to display ____________________________________________  EKG  none    ____________________________________________  RADIOLOGY  I have personally reviewed the images performed during this visit and I agree with the Radiologist's read.   Interpretation by Radiologist:  Ct Head Wo Contrast  Result Date: 08/27/2017 CLINICAL DATA:  Week-long headache with right facial numbness today. EXAM: CT HEAD WITHOUT CONTRAST TECHNIQUE: Contiguous axial images were obtained from the base of the skull through the vertex without intravenous contrast. COMPARISON:  None. FINDINGS: Brain: There is no mass, hemorrhage or extra-axial collection. The size and configuration of the ventricles and extra-axial CSF spaces are normal. There is no acute or chronic infarction. The brain parenchyma is normal. Vascular: No abnormal hyperdensity of the major intracranial arteries or dural venous sinuses. No intracranial atherosclerosis. Skull: The visualized skull base, calvarium and extracranial soft tissues are normal. Sinuses/Orbits: No fluid levels or advanced mucosal thickening of the visualized paranasal sinuses. No mastoid or middle ear effusion. The orbits are normal. IMPRESSION: Normal head CT. Electronically Signed   By: Cletus Gash.D.  On: 08/27/2017 14:16    ____________________________________________   PROCEDURES  Procedure(s) performed:yes .Nerve Block Date/Time: 08/27/2017 3:36 PM Performed by: Rudene Re, MD Authorized by: Rudene Re, MD   Consent:    Consent obtained:  Verbal   Consent given by:  Patient   Risks discussed:  Allergic reaction, infection, nerve damage, unsuccessful block and pain   Alternatives discussed:  No treatment Indications:    Indications:  Pain relief Location:    Body area:  Head   Head nerve:  Occipital   Laterality:  Right Pre-procedure details:    Skin preparation:  2% chlorhexidine   Preparation: Patient was prepped and draped in usual sterile fashion   Procedure details (see MAR for exact dosages):    Block needle  gauge:  25 G   Guidance comment:  Anatomical landmarks   Anesthetic injected:  Bupivacaine 0.5% w/o epi and lidocaine 1% w/o epi   Steroid injected:  None   Additive injected:  None   Injection procedure:  Anatomic landmarks identified, incremental injection, negative aspiration for blood and anatomic landmarks palpated   Paresthesia:  None Post-procedure details:    Dressing:  None   Outcome:  Pain relieved   Patient tolerance of procedure:  Tolerated well, no immediate complications   Critical Care performed:  None ____________________________________________   INITIAL IMPRESSION / ASSESSMENT AND PLAN / ED COURSE  52 y.o. female with a history of diabetes who presents for evaluation of a headache.  Patient with pain and tenderness in one specific point over the right occipital base of the skull overlying the occipital nerve.  Patient is completely neurologically intact at this time, she has no bruits, no clinical signs or symptoms of any possible etiology related to the known carotid artery aneurysm. Head CT negative. Presentation concerning for occiptal HA vs tension HA. Occipital nerve block performed. Patient tolerated well. Patient will be given fioricet and flexeril for symptoms.     _________________________ 4:10 PM on 08/27/2017 -----------------------------------------  HA has fully resolved. Patient remains well appearing and neuro intact. Will dc home with close f/u with PCP. Dicussed return precautions for any signs of stroke, fever, thunderclap headache.   As part of my medical decision making, I reviewed the following data within the [place] notes reviewed and incorporated, Old chart reviewed, Radiograph reviewed , Notes from prior ED visits and [place] Controlled Substance Database    Pertinent labs & imaging results that were available during my care of the patient were reviewed by me and considered in my medical decision making (see chart for  details).    ____________________________________________   FINAL CLINICAL IMPRESSION(S) / ED DIAGNOSES  Final diagnoses:  Unilateral occipital headache      NEW MEDICATIONS STARTED DURING THIS VISIT:  ED Discharge Orders         Ordered    ondansetron (ZOFRAN ODT) 4 MG disintegrating tablet  Every 8 hours PRN,   Status:  Discontinued     08/27/17 1612    butalbital-acetaminophen-caffeine (FIORICET, ESGIC) 50-325-40 MG tablet  Every 6 hours PRN     08/27/17 1612    ondansetron (ZOFRAN ODT) 4 MG disintegrating tablet  Every 8 hours PRN     08/27/17 1620    cyclobenzaprine (FLEXERIL) 5 MG tablet  3 times daily PRN     08/27/17 1620           Note:  This document was prepared using Dragon voice recognition software and may include unintentional dictation  errors.    Alfred Levins, [place], MD 08/27/17 [phone]

## 2017-08-27 NOTE — ED Triage Notes (Signed)
Pt presents to ED via POV with c/o of tightness and pressure to posterior lower right head. Pt states that this headache has been going on for about a week, but she woke up this morning and her right side of face was numb. Pt states numbness is new. Pt states that headaches come randomly and she gets cooling sensation after they ease off. Pain radiates down neck as well. Pt in NAD at this time.

## 2017-08-27 NOTE — Discharge Instructions (Addendum)

## 2017-08-27 NOTE — ED Notes (Signed)
Pt states that when she woke up thi smorning her neck hurt and was hard to turn. Pt states she has had this for about 4 days.  Pt is A/O x4. Awaiting EDP

## 2017-09-23 DIAGNOSIS — E038 Other specified hypothyroidism: Secondary | ICD-10-CM | POA: Insufficient documentation

## 2017-09-23 DIAGNOSIS — G4733 Obstructive sleep apnea (adult) (pediatric): Secondary | ICD-10-CM | POA: Insufficient documentation

## 2017-09-23 DIAGNOSIS — R519 Headache, unspecified: Secondary | ICD-10-CM | POA: Insufficient documentation

## 2018-03-11 ENCOUNTER — Other Ambulatory Visit: Payer: Self-pay | Admitting: Internal Medicine

## 2018-03-11 DIAGNOSIS — R202 Paresthesia of skin: Secondary | ICD-10-CM

## 2018-03-23 ENCOUNTER — Inpatient Hospital Stay: Admission: RE | Admit: 2018-03-23 | Payer: BLUE CROSS/BLUE SHIELD | Source: Ambulatory Visit

## 2018-04-02 ENCOUNTER — Ambulatory Visit
Admission: RE | Admit: 2018-04-02 | Discharge: 2018-04-02 | Disposition: A | Discharge status: Home / Self Care | Payer: BLUE CROSS/BLUE SHIELD | Source: Ambulatory Visit | Attending: Internal Medicine | Admitting: Internal Medicine

## 2018-04-02 ENCOUNTER — Other Ambulatory Visit: Payer: Self-pay

## 2018-04-02 DIAGNOSIS — R202 Paresthesia of skin: Secondary | ICD-10-CM

## 2018-04-02 MED ORDER — GADOBENATE DIMEGLUMINE 529 MG/ML IV SOLN
20.0000 mL | Freq: Once | INTRAVENOUS | Status: AC | PRN
  Administered 2018-04-02: 20 mL via INTRAVENOUS

## 2018-04-28 DIAGNOSIS — R7303 Prediabetes: Secondary | ICD-10-CM | POA: Insufficient documentation

## 2018-04-28 DIAGNOSIS — E785 Hyperlipidemia, unspecified: Secondary | ICD-10-CM | POA: Insufficient documentation

## 2018-07-16 DIAGNOSIS — N644 Mastodynia: Secondary | ICD-10-CM | POA: Diagnosis not present

## 2018-07-17 DIAGNOSIS — R300 Dysuria: Secondary | ICD-10-CM | POA: Diagnosis not present

## 2018-08-20 DIAGNOSIS — H9201 Otalgia, right ear: Secondary | ICD-10-CM | POA: Diagnosis not present

## 2018-08-31 DIAGNOSIS — M25461 Effusion, right knee: Secondary | ICD-10-CM | POA: Diagnosis not present

## 2018-08-31 DIAGNOSIS — M25561 Pain in right knee: Secondary | ICD-10-CM | POA: Diagnosis not present

## 2018-09-08 DIAGNOSIS — M1711 Unilateral primary osteoarthritis, right knee: Secondary | ICD-10-CM | POA: Diagnosis not present

## 2018-09-08 DIAGNOSIS — M25561 Pain in right knee: Secondary | ICD-10-CM | POA: Diagnosis not present

## 2018-09-16 DIAGNOSIS — M1711 Unilateral primary osteoarthritis, right knee: Secondary | ICD-10-CM | POA: Diagnosis not present

## 2018-09-16 DIAGNOSIS — M25561 Pain in right knee: Secondary | ICD-10-CM | POA: Diagnosis not present

## 2018-09-24 DIAGNOSIS — M1711 Unilateral primary osteoarthritis, right knee: Secondary | ICD-10-CM | POA: Diagnosis not present

## 2018-09-24 DIAGNOSIS — M25561 Pain in right knee: Secondary | ICD-10-CM | POA: Diagnosis not present

## 2018-09-24 DIAGNOSIS — M2410 Other articular cartilage disorders, unspecified site: Secondary | ICD-10-CM | POA: Diagnosis not present

## 2018-09-24 DIAGNOSIS — M25461 Effusion, right knee: Secondary | ICD-10-CM | POA: Diagnosis not present

## 2018-09-25 ENCOUNTER — Other Ambulatory Visit: Payer: Self-pay

## 2018-09-25 DIAGNOSIS — Z20822 Contact with and (suspected) exposure to covid-19: Secondary | ICD-10-CM

## 2018-09-27 LAB — NOVEL CORONAVIRUS, NAA: SARS-CoV-2, NAA: NOT DETECTED

## 2018-10-02 DIAGNOSIS — M2408 Loose body, other site: Secondary | ICD-10-CM | POA: Diagnosis not present

## 2018-10-02 DIAGNOSIS — M2340 Loose body in knee, unspecified knee: Secondary | ICD-10-CM | POA: Insufficient documentation

## 2018-10-19 DIAGNOSIS — J329 Chronic sinusitis, unspecified: Secondary | ICD-10-CM | POA: Diagnosis not present

## 2018-10-19 DIAGNOSIS — N952 Postmenopausal atrophic vaginitis: Secondary | ICD-10-CM | POA: Diagnosis not present

## 2018-10-19 DIAGNOSIS — N644 Mastodynia: Secondary | ICD-10-CM | POA: Diagnosis not present

## 2018-11-30 DIAGNOSIS — E28319 Asymptomatic premature menopause: Secondary | ICD-10-CM | POA: Diagnosis not present

## 2018-11-30 DIAGNOSIS — N951 Menopausal and female climacteric states: Secondary | ICD-10-CM | POA: Diagnosis not present

## 2018-12-03 DIAGNOSIS — Z20828 Contact with and (suspected) exposure to other viral communicable diseases: Secondary | ICD-10-CM | POA: Diagnosis not present

## 2018-12-03 DIAGNOSIS — M2408 Loose body, other site: Secondary | ICD-10-CM | POA: Diagnosis not present

## 2018-12-03 DIAGNOSIS — Z01812 Encounter for preprocedural laboratory examination: Secondary | ICD-10-CM | POA: Diagnosis not present

## 2018-12-08 DIAGNOSIS — R5383 Other fatigue: Secondary | ICD-10-CM | POA: Diagnosis not present

## 2018-12-08 DIAGNOSIS — S83281A Other tear of lateral meniscus, current injury, right knee, initial encounter: Secondary | ICD-10-CM | POA: Diagnosis not present

## 2018-12-08 DIAGNOSIS — R0602 Shortness of breath: Secondary | ICD-10-CM | POA: Diagnosis not present

## 2018-12-08 DIAGNOSIS — R569 Unspecified convulsions: Secondary | ICD-10-CM | POA: Diagnosis not present

## 2018-12-08 DIAGNOSIS — M2391 Unspecified internal derangement of right knee: Secondary | ICD-10-CM | POA: Diagnosis not present

## 2018-12-08 DIAGNOSIS — M23261 Derangement of other lateral meniscus due to old tear or injury, right knee: Secondary | ICD-10-CM | POA: Diagnosis not present

## 2018-12-08 DIAGNOSIS — F445 Conversion disorder with seizures or convulsions: Secondary | ICD-10-CM | POA: Diagnosis not present

## 2018-12-08 DIAGNOSIS — Z9889 Other specified postprocedural states: Secondary | ICD-10-CM | POA: Diagnosis not present

## 2018-12-08 DIAGNOSIS — M94261 Chondromalacia, right knee: Secondary | ICD-10-CM | POA: Diagnosis not present

## 2018-12-08 DIAGNOSIS — X58XXXA Exposure to other specified factors, initial encounter: Secondary | ICD-10-CM | POA: Diagnosis not present

## 2018-12-08 DIAGNOSIS — I959 Hypotension, unspecified: Secondary | ICD-10-CM | POA: Diagnosis not present

## 2018-12-08 DIAGNOSIS — M2341 Loose body in knee, right knee: Secondary | ICD-10-CM | POA: Diagnosis not present

## 2018-12-24 DIAGNOSIS — M25561 Pain in right knee: Secondary | ICD-10-CM | POA: Diagnosis not present

## 2018-12-24 DIAGNOSIS — M6281 Muscle weakness (generalized): Secondary | ICD-10-CM | POA: Diagnosis not present

## 2018-12-24 DIAGNOSIS — M2408 Loose body, other site: Secondary | ICD-10-CM | POA: Diagnosis not present

## 2018-12-24 DIAGNOSIS — M25661 Stiffness of right knee, not elsewhere classified: Secondary | ICD-10-CM | POA: Diagnosis not present

## 2018-12-25 DIAGNOSIS — M6281 Muscle weakness (generalized): Secondary | ICD-10-CM | POA: Diagnosis not present

## 2018-12-25 DIAGNOSIS — M2408 Loose body, other site: Secondary | ICD-10-CM | POA: Diagnosis not present

## 2018-12-25 DIAGNOSIS — M25661 Stiffness of right knee, not elsewhere classified: Secondary | ICD-10-CM | POA: Diagnosis not present

## 2018-12-25 DIAGNOSIS — M25561 Pain in right knee: Secondary | ICD-10-CM | POA: Diagnosis not present

## 2018-12-28 DIAGNOSIS — M6281 Muscle weakness (generalized): Secondary | ICD-10-CM | POA: Diagnosis not present

## 2018-12-28 DIAGNOSIS — M2408 Loose body, other site: Secondary | ICD-10-CM | POA: Diagnosis not present

## 2018-12-28 DIAGNOSIS — M25661 Stiffness of right knee, not elsewhere classified: Secondary | ICD-10-CM | POA: Diagnosis not present

## 2018-12-28 DIAGNOSIS — M25561 Pain in right knee: Secondary | ICD-10-CM | POA: Diagnosis not present

## 2019-01-04 DIAGNOSIS — M2408 Loose body, other site: Secondary | ICD-10-CM | POA: Diagnosis not present

## 2019-01-04 DIAGNOSIS — M6281 Muscle weakness (generalized): Secondary | ICD-10-CM | POA: Diagnosis not present

## 2019-01-04 DIAGNOSIS — M25661 Stiffness of right knee, not elsewhere classified: Secondary | ICD-10-CM | POA: Diagnosis not present

## 2019-01-04 DIAGNOSIS — M25561 Pain in right knee: Secondary | ICD-10-CM | POA: Diagnosis not present

## 2019-01-06 DIAGNOSIS — M25561 Pain in right knee: Secondary | ICD-10-CM | POA: Diagnosis not present

## 2019-01-06 DIAGNOSIS — M25661 Stiffness of right knee, not elsewhere classified: Secondary | ICD-10-CM | POA: Diagnosis not present

## 2019-01-06 DIAGNOSIS — M6281 Muscle weakness (generalized): Secondary | ICD-10-CM | POA: Diagnosis not present

## 2019-01-06 DIAGNOSIS — M2408 Loose body, other site: Secondary | ICD-10-CM | POA: Diagnosis not present

## 2019-01-11 DIAGNOSIS — M25661 Stiffness of right knee, not elsewhere classified: Secondary | ICD-10-CM | POA: Diagnosis not present

## 2019-01-11 DIAGNOSIS — M25561 Pain in right knee: Secondary | ICD-10-CM | POA: Diagnosis not present

## 2019-01-11 DIAGNOSIS — M2408 Loose body, other site: Secondary | ICD-10-CM | POA: Diagnosis not present

## 2019-01-11 DIAGNOSIS — M6281 Muscle weakness (generalized): Secondary | ICD-10-CM | POA: Diagnosis not present

## 2019-01-13 DIAGNOSIS — M2408 Loose body, other site: Secondary | ICD-10-CM | POA: Diagnosis not present

## 2019-01-13 DIAGNOSIS — M25561 Pain in right knee: Secondary | ICD-10-CM | POA: Diagnosis not present

## 2019-01-13 DIAGNOSIS — M25661 Stiffness of right knee, not elsewhere classified: Secondary | ICD-10-CM | POA: Diagnosis not present

## 2019-01-13 DIAGNOSIS — M6281 Muscle weakness (generalized): Secondary | ICD-10-CM | POA: Diagnosis not present

## 2019-01-20 DIAGNOSIS — M25661 Stiffness of right knee, not elsewhere classified: Secondary | ICD-10-CM | POA: Diagnosis not present

## 2019-01-20 DIAGNOSIS — M6281 Muscle weakness (generalized): Secondary | ICD-10-CM | POA: Diagnosis not present

## 2019-01-20 DIAGNOSIS — M25561 Pain in right knee: Secondary | ICD-10-CM | POA: Diagnosis not present

## 2019-01-20 DIAGNOSIS — M2408 Loose body, other site: Secondary | ICD-10-CM | POA: Diagnosis not present

## 2019-01-21 DIAGNOSIS — M2408 Loose body, other site: Secondary | ICD-10-CM | POA: Diagnosis not present

## 2019-01-21 DIAGNOSIS — M533 Sacrococcygeal disorders, not elsewhere classified: Secondary | ICD-10-CM | POA: Diagnosis not present

## 2019-01-25 DIAGNOSIS — M2408 Loose body, other site: Secondary | ICD-10-CM | POA: Diagnosis not present

## 2019-01-25 DIAGNOSIS — M25561 Pain in right knee: Secondary | ICD-10-CM | POA: Diagnosis not present

## 2019-01-25 DIAGNOSIS — M6281 Muscle weakness (generalized): Secondary | ICD-10-CM | POA: Diagnosis not present

## 2019-01-25 DIAGNOSIS — M25661 Stiffness of right knee, not elsewhere classified: Secondary | ICD-10-CM | POA: Diagnosis not present

## 2019-01-27 DIAGNOSIS — M533 Sacrococcygeal disorders, not elsewhere classified: Secondary | ICD-10-CM | POA: Diagnosis not present

## 2019-01-27 DIAGNOSIS — M545 Low back pain: Secondary | ICD-10-CM | POA: Diagnosis not present

## 2019-02-03 DIAGNOSIS — M2408 Loose body, other site: Secondary | ICD-10-CM | POA: Diagnosis not present

## 2019-02-03 DIAGNOSIS — M6281 Muscle weakness (generalized): Secondary | ICD-10-CM | POA: Diagnosis not present

## 2019-02-03 DIAGNOSIS — M25561 Pain in right knee: Secondary | ICD-10-CM | POA: Diagnosis not present

## 2019-02-03 DIAGNOSIS — M25661 Stiffness of right knee, not elsewhere classified: Secondary | ICD-10-CM | POA: Diagnosis not present

## 2019-02-08 DIAGNOSIS — M2408 Loose body, other site: Secondary | ICD-10-CM | POA: Diagnosis not present

## 2019-02-08 DIAGNOSIS — M25661 Stiffness of right knee, not elsewhere classified: Secondary | ICD-10-CM | POA: Diagnosis not present

## 2019-02-08 DIAGNOSIS — M25561 Pain in right knee: Secondary | ICD-10-CM | POA: Diagnosis not present

## 2019-02-08 DIAGNOSIS — M6281 Muscle weakness (generalized): Secondary | ICD-10-CM | POA: Diagnosis not present

## 2019-02-10 DIAGNOSIS — M2408 Loose body, other site: Secondary | ICD-10-CM | POA: Diagnosis not present

## 2019-02-10 DIAGNOSIS — M6281 Muscle weakness (generalized): Secondary | ICD-10-CM | POA: Diagnosis not present

## 2019-02-10 DIAGNOSIS — M25561 Pain in right knee: Secondary | ICD-10-CM | POA: Diagnosis not present

## 2019-02-10 DIAGNOSIS — M25661 Stiffness of right knee, not elsewhere classified: Secondary | ICD-10-CM | POA: Diagnosis not present

## 2019-02-16 DIAGNOSIS — M545 Low back pain: Secondary | ICD-10-CM | POA: Diagnosis not present

## 2019-02-19 ENCOUNTER — Encounter (HOSPITAL_COMMUNITY): Payer: Self-pay

## 2019-02-19 ENCOUNTER — Other Ambulatory Visit: Payer: Self-pay

## 2019-02-19 ENCOUNTER — Emergency Department (HOSPITAL_COMMUNITY)
Admission: EM | Admit: 2019-02-19 | Discharge: 2019-02-19 | Disposition: A | Discharge status: Home / Self Care | Payer: BC Managed Care – PPO | Attending: Emergency Medicine | Admitting: Emergency Medicine

## 2019-02-19 ENCOUNTER — Emergency Department (HOSPITAL_COMMUNITY): Payer: BC Managed Care – PPO

## 2019-02-19 DIAGNOSIS — Z20822 Contact with and (suspected) exposure to covid-19: Secondary | ICD-10-CM | POA: Insufficient documentation

## 2019-02-19 DIAGNOSIS — E669 Obesity, unspecified: Secondary | ICD-10-CM | POA: Diagnosis not present

## 2019-02-19 DIAGNOSIS — Z79899 Other long term (current) drug therapy: Secondary | ICD-10-CM | POA: Diagnosis not present

## 2019-02-19 DIAGNOSIS — R531 Weakness: Secondary | ICD-10-CM

## 2019-02-19 DIAGNOSIS — R42 Dizziness and giddiness: Secondary | ICD-10-CM | POA: Diagnosis not present

## 2019-02-19 DIAGNOSIS — R569 Unspecified convulsions: Secondary | ICD-10-CM

## 2019-02-19 DIAGNOSIS — R0689 Other abnormalities of breathing: Secondary | ICD-10-CM | POA: Diagnosis not present

## 2019-02-19 DIAGNOSIS — E119 Type 2 diabetes mellitus without complications: Secondary | ICD-10-CM | POA: Diagnosis not present

## 2019-02-19 DIAGNOSIS — E876 Hypokalemia: Secondary | ICD-10-CM | POA: Diagnosis not present

## 2019-02-19 DIAGNOSIS — R404 Transient alteration of awareness: Secondary | ICD-10-CM | POA: Diagnosis not present

## 2019-02-19 DIAGNOSIS — Z6841 Body Mass Index (BMI) 40.0 and over, adult: Secondary | ICD-10-CM | POA: Diagnosis not present

## 2019-02-19 DIAGNOSIS — R29810 Facial weakness: Secondary | ICD-10-CM | POA: Diagnosis not present

## 2019-02-19 DIAGNOSIS — R270 Ataxia, unspecified: Secondary | ICD-10-CM | POA: Diagnosis not present

## 2019-02-19 DIAGNOSIS — R29818 Other symptoms and signs involving the nervous system: Secondary | ICD-10-CM | POA: Diagnosis not present

## 2019-02-19 LAB — CBC
HCT: 38.1 % (ref 36.0–46.0)
Hemoglobin: 12.2 g/dL (ref 12.0–15.0)
MCH: 30.3 pg (ref 26.0–34.0)
MCHC: 32 g/dL (ref 30.0–36.0)
MCV: 94.5 fL (ref 80.0–100.0)
Platelets: 329 10*3/uL (ref 150–400)
RBC: 4.03 MIL/uL (ref 3.87–5.11)
RDW: 12.9 % (ref 11.5–15.5)
WBC: 13 10*3/uL — ABNORMAL HIGH (ref 4.0–10.5)
nRBC: 0 % (ref 0.0–0.2)

## 2019-02-19 LAB — I-STAT CHEM 8, ED
BUN: 12 mg/dL (ref 6–20)
Calcium, Ion: 1.14 mmol/L — ABNORMAL LOW (ref 1.15–1.40)
Chloride: 101 mmol/L (ref 98–111)
Creatinine, Ser: 0.8 mg/dL (ref 0.44–1.00)
Glucose, Bld: 140 mg/dL — ABNORMAL HIGH (ref 70–99)
HCT: 39 % (ref 36.0–46.0)
Hemoglobin: 13.3 g/dL (ref 12.0–15.0)
Potassium: 2.9 mmol/L — ABNORMAL LOW (ref 3.5–5.1)
Sodium: 138 mmol/L (ref 135–145)
TCO2: 25 mmol/L (ref 22–32)

## 2019-02-19 LAB — RESPIRATORY PANEL BY RT PCR (FLU A&B, COVID)
Influenza A by PCR: NEGATIVE
Influenza B by PCR: NEGATIVE
SARS Coronavirus 2 by RT PCR: NEGATIVE

## 2019-02-19 LAB — DIFFERENTIAL
Abs Immature Granulocytes: 0.05 10*3/uL (ref 0.00–0.07)
Basophils Absolute: 0.1 10*3/uL (ref 0.0–0.1)
Basophils Relative: 1 %
Eosinophils Absolute: 0.1 10*3/uL (ref 0.0–0.5)
Eosinophils Relative: 1 %
Immature Granulocytes: 0 %
Lymphocytes Relative: 21 %
Lymphs Abs: 2.8 10*3/uL (ref 0.7–4.0)
Monocytes Absolute: 1 10*3/uL (ref 0.1–1.0)
Monocytes Relative: 7 %
Neutro Abs: 9.1 10*3/uL — ABNORMAL HIGH (ref 1.7–7.7)
Neutrophils Relative %: 70 %

## 2019-02-19 LAB — COMPREHENSIVE METABOLIC PANEL
ALT: 24 U/L (ref 0–44)
AST: 24 U/L (ref 15–41)
Albumin: 3.5 g/dL (ref 3.5–5.0)
Alkaline Phosphatase: 68 U/L (ref 38–126)
Anion gap: 14 (ref 5–15)
BUN: 11 mg/dL (ref 6–20)
CO2: 20 mmol/L — ABNORMAL LOW (ref 22–32)
Calcium: 8.8 mg/dL — ABNORMAL LOW (ref 8.9–10.3)
Chloride: 103 mmol/L (ref 98–111)
Creatinine, Ser: 0.95 mg/dL (ref 0.44–1.00)
GFR calc Af Amer: >60 mL/min (ref >60)
GFR calc non Af Amer: >60 mL/min (ref >60)
Glucose, Bld: 142 mg/dL — ABNORMAL HIGH (ref 70–99)
Potassium: 2.9 mmol/L — ABNORMAL LOW (ref 3.5–5.1)
Sodium: 137 mmol/L (ref 135–145)
Total Bilirubin: 0.6 mg/dL (ref 0.3–1.2)
Total Protein: 7.4 g/dL (ref 6.5–8.1)

## 2019-02-19 LAB — CBG MONITORING, ED: Glucose-Capillary: 131 mg/dL — ABNORMAL HIGH (ref 70–99)

## 2019-02-19 LAB — I-STAT BETA HCG BLOOD, ED (MC, WL, AP ONLY): I-stat hCG, quantitative: <5 m[IU]/mL (ref <5)

## 2019-02-19 LAB — PROTIME-INR
INR: 1.1 (ref 0.8–1.2)
Prothrombin Time: 13.7 seconds (ref 11.4–15.2)

## 2019-02-19 LAB — APTT: aPTT: 27 seconds (ref 24–36)

## 2019-02-19 MED ORDER — LACTATED RINGERS IV BOLUS
1000.0000 mL | Freq: Once | INTRAVENOUS | Status: AC
  Administered 2019-02-19: 17:00:00 1000 mL via INTRAVENOUS

## 2019-02-19 MED ORDER — MECLIZINE HCL 25 MG PO TABS
50.0000 mg | ORAL_TABLET | Freq: Once | ORAL | Status: AC
  Administered 2019-02-19: 17:00:00 50 mg via ORAL
  Filled 2019-02-19: qty 2

## 2019-02-19 MED ORDER — SODIUM CHLORIDE 0.9% FLUSH
3.0000 mL | Freq: Once | INTRAVENOUS | Status: AC
  Administered 2019-02-19: 3 mL via INTRAVENOUS

## 2019-02-19 MED ORDER — LEVETIRACETAM 500 MG PO TABS: 500.0000 mg | ORAL_TABLET | Freq: Two times a day (BID) | ORAL | 0 refills | Status: DC

## 2019-02-19 MED ORDER — LEVETIRACETAM IN NACL 1000 MG/100ML IV SOLN
1000.0000 mg | Freq: Once | INTRAVENOUS | Status: AC
  Administered 2019-02-19: 1000 mg via INTRAVENOUS
  Filled 2019-02-19: qty 100

## 2019-02-19 MED ORDER — POTASSIUM CHLORIDE CRYS ER 20 MEQ PO TBCR: 20.0000 meq | EXTENDED_RELEASE_TABLET | Freq: Two times a day (BID) | ORAL | 0 refills | Status: DC

## 2019-02-19 MED ORDER — IOHEXOL 350 MG/ML SOLN
100.0000 mL | Freq: Once | INTRAVENOUS | Status: AC | PRN
  Administered 2019-02-19: 100 mL via INTRAVENOUS

## 2019-02-19 MED ORDER — MECLIZINE HCL 25 MG PO TABS: 25.0000 mg | ORAL_TABLET | Freq: Three times a day (TID) | ORAL | 0 refills | Status: AC | PRN

## 2019-02-19 MED ORDER — POTASSIUM CHLORIDE CRYS ER 20 MEQ PO TBCR
40.0000 meq | EXTENDED_RELEASE_TABLET | Freq: Once | ORAL | Status: AC
  Administered 2019-02-19: 16:00:00 40 meq via ORAL
  Filled 2019-02-19: qty 2

## 2019-02-19 MED ORDER — MAG-OXIDE 200 MG PO TABS: 200.0000 mg | ORAL_TABLET | Freq: Every day | ORAL | 0 refills | Status: DC

## 2019-02-19 MED ORDER — LORAZEPAM 2 MG/ML IJ SOLN
INTRAMUSCULAR | Status: AC
  Administered 2019-02-19: 2 mg
  Filled 2019-02-19: qty 1

## 2019-02-19 NOTE — ED Notes (Signed)
Pt transported to MRI 

## 2019-02-19 NOTE — ED Notes (Signed)
EEG at bedside.

## 2019-02-19 NOTE — ED Provider Notes (Addendum)
[place] EMERGENCY DEPARTMENT Provider Note   CSN: [id number] Arrival date & time: 02/19/19  1336  An emergency department physician performed an initial assessment on this suspected stroke patient at 1324.  History Chief Complaint  Patient presents with  . Code Stroke    Madison Armstrong is a 54 y.o. female.  HPI  Level 5 caveat secondary to patient being nonverbal at this time This is a 54 year old female who presents today via [facility] EMS with reports of left-sided weakness.  They received a call at approximately 1215 that the patient felt like she was going to pass out.  She was driving her car.  She pulled over to the side and called her husband.  Her husband called 73.  On EMS arrival at approximately 1240 she was awake and alert.  They were able to move her with her assistance to stretcher.  After getting her into the ambulance, she had tonic-clonic jerking that lasted approximately 20 seconds at about 5 minutes of being post ictal.  After that time she was flaccid on her left side.  She was called a code stroke.  She vomited at some point prehospital.  EMS checked prehospital blood sugar and report that it was greater than 100 No prior history of seizure reported     Past Medical History:  Diagnosis Date  . Chicken pox   . Diabetes mellitus without complication [facility])     Patient Active Problem List   Diagnosis Date Noted  . Multiple somatic complaints 09/13/2015    Past Surgical History:  Procedure Laterality Date  . ABDOMINAL HYSTERECTOMY    . COLONOSCOPY WITH PROPOFOL N/A 08/31/2014   Procedure: COLONOSCOPY WITH PROPOFOL;  Surgeon: Manya Silvas, MD;  Location[facility] ENDOSCOPY;  Service: Endoscopy;  Laterality: N/A;  . ESOPHAGOGASTRODUODENOSCOPY (EGD) WITH PROPOFOL N/A 08/31/2014   Procedure: ESOPHAGOGASTRODUODENOSCOPY (EGD) WITH PROPOFOL;  Surgeon: Manya Silvas, MD;  Location: [facility] ENDOSCOPY;  Service: Endoscopy;  Laterality: N/A;    . nipple removal       OB History    Gravida  2   Para  2   Term  2   Preterm      AB      Living  2     SAB      TAB      Ectopic      Multiple      Live Births  2           Family History  Problem Relation Age of Onset  . Stroke Mother   . Hypertension Mother   . Hypertension Father   . Ovarian cancer Maternal Grandmother   . Stroke Maternal Grandfather   . Throat cancer Paternal Grandmother   . Heart disease Paternal Grandfather   . Heart disease Brother        Passed away at 47    Social History   Tobacco Use  . Smoking status: Never Smoker  . Smokeless tobacco: Never Used  Substance Use Topics  . Alcohol use: No  . Drug use: No    Home Medications Prior to Admission medications   Medication Sig Start Date End Date Taking? Authorizing Provider  cyclobenzaprine (FLEXERIL) 5 MG tablet Take 1 tablet (5 mg total) by mouth 3 (three) times daily as needed for muscle spasms. 08/27/17   Rudene Re, MD  meclizine (ANTIVERT) 25 MG tablet Take 25 mg by mouth 3 (three) times daily as needed for dizziness.  [provider]  omeprazole (PRILOSEC) 40 MG capsule Take 40 mg by mouth daily.    [provider]  ondansetron (ZOFRAN ODT) 4 MG disintegrating tablet Take 1 tablet (4 mg total) by mouth every 8 (eight) hours as needed for nausea or vomiting. 08/27/17   Rudene Re, MD    Allergies    Patient has no known allergies.  Review of Systems   Review of Systems  Unable to perform ROS: Patient nonverbal    Physical Exam Updated Vital Signs BP 118/63 (BP Location: Right Arm)   Pulse 91   Temp (!) 97.5 F (36.4 C) (Oral)   Resp 18   Ht 1.753 m (5\' 9" )   Wt 127 kg   SpO2 99%   BMI 41.35 kg/m   Physical Exam Vitals and nursing note reviewed.  Constitutional:      Appearance: She is obese. She is ill-appearing.  HENT:     Head: Normocephalic.     Right Ear: External ear normal.     Left Ear: External ear  normal.     Nose: Nose normal.     Mouth/Throat:     Mouth: Mucous membranes are moist.  Eyes:     Comments: Patient with gaze to right pupils are equal round reactive  Cardiovascular:     Rate and Rhythm: Tachycardia present.     Pulses: Normal pulses.  Pulmonary:     Effort: Pulmonary effort is normal.  Abdominal:     Palpations: Abdomen is soft.  Musculoskeletal:        General: Normal range of motion.     Cervical back: Normal range of motion.  Skin:    General: Skin is warm.     Capillary Refill: Capillary refill takes less than 2 seconds.  Neurological:     Mental Status: She is alert.     Comments: Left arm flaccid left leg unable to lift against gravity Right arm appears somewhat weak but she is able to hold against gravity.  She is able to raise her right leg up off the bed against gravity.     ED Results / Procedures / Treatments   Labs (all labs ordered are listed, but only abnormal results are displayed) Labs Reviewed  CBC - Abnormal; Notable for the following components:      Result Value   WBC 13.0 (*)    All other components within normal limits  DIFFERENTIAL - Abnormal; Notable for the following components:   Neutro Abs 9.1 (*)    All other components within normal limits  I-STAT CHEM 8, ED - Abnormal; Notable for the following components:   Potassium 2.9 (*)    Glucose, Bld 140 (*)    Calcium, Ion 1.14 (*)    All other components within normal limits  CBG MONITORING, ED - Abnormal; Notable for the following components:   Glucose-Capillary 131 (*)    All other components within normal limits  RESPIRATORY PANEL BY RT PCR (FLU A&B, COVID)  PROTIME-INR  APTT  COMPREHENSIVE METABOLIC PANEL  I-STAT BETA HCG BLOOD, ED ([facility], [facility], [facility] ONLY)    EKG None  Radiology CT Code Stroke CTA Head W/WO contrast  Result Date: 02/19/2019 CLINICAL DATA:  Left-sided weakness.  Rule out stroke EXAM: CT ANGIOGRAPHY HEAD AND NECK CT PERFUSION BRAIN TECHNIQUE: Multidetector  CT imaging of the head and neck was performed using the standard protocol during bolus administration of intravenous contrast. Multiplanar CT image reconstructions and MIPs were obtained to evaluate  the vascular anatomy. Carotid stenosis measurements (when applicable) are obtained utilizing NASCET criteria, using the distal internal carotid diameter as the denominator. Multiphase CT imaging of the brain was performed following IV bolus contrast injection. Subsequent parametric perfusion maps were calculated using RAPID software. CONTRAST:  167mL OMNIPAQUE IOHEXOL 350 MG/ML SOLN COMPARISON:  CT head 02/19/2019 FINDINGS: CTA NECK FINDINGS Aortic arch: Standard branching. Imaged portion shows no evidence of aneurysm or dissection. No significant stenosis of the major arch vessel origins. Right carotid system: Normal right carotid without stenosis dissection or atherosclerotic disease. Left carotid system: Normal left carotid without stenosis dissection or atherosclerotic disease. Vertebral arteries: Both vertebral arteries widely patent to the basilar without stenosis. Skeleton: Negative Other neck: Mild thyroid enlargement.  No mass or adenopathy. Upper chest: Negative Review of the MIP images confirms the above findings CTA HEAD FINDINGS Anterior circulation: Cavernous carotid widely patent bilaterally. Anterior and middle cerebral arteries widely patent bilaterally without stenosis or occlusion. Posterior circulation: Both vertebral arteries patent to the basilar. Basilar widely patent. Superior cerebellar and posterior cerebral arteries patent bilaterally without stenosis or occlusion. Venous sinuses: Normal venous enhancement. Anatomic variants: None Review of the MIP images confirms the above findings CT Brain Perfusion Findings: ASPECTS: 10 CBF (<30%) Volume: 39mL Perfusion (Tmax>6.0s) volume: 62mL Mismatch Volume: 75mL Infarction Location:None IMPRESSION: CT perfusion negative for infarct or ischemia Normal CTA  head neck. No significant intracranial or extracranial stenosis. Negative for large vessel occlusion. These results were called by telephone at the time of interpretation on 02/19/2019 at 2:07 pm to provider Dr. Cheral Marker, Who verbally acknowledged these results. Electronically Signed   By: Franchot Gallo M.D.   On: 02/19/2019 14:07   CT Code Stroke CTA Neck W/WO contrast  Result Date: 02/19/2019 CLINICAL DATA:  Left-sided weakness.  Rule out stroke EXAM: CT ANGIOGRAPHY HEAD AND NECK CT PERFUSION BRAIN TECHNIQUE: Multidetector CT imaging of the head and neck was performed using the standard protocol during bolus administration of intravenous contrast. Multiplanar CT image reconstructions and MIPs were obtained to evaluate the vascular anatomy. Carotid stenosis measurements (when applicable) are obtained utilizing NASCET criteria, using the distal internal carotid diameter as the denominator. Multiphase CT imaging of the brain was performed following IV bolus contrast injection. Subsequent parametric perfusion maps were calculated using RAPID software. CONTRAST:  171mL OMNIPAQUE IOHEXOL 350 MG/ML SOLN COMPARISON:  CT head 02/19/2019 FINDINGS: CTA NECK FINDINGS Aortic arch: Standard branching. Imaged portion shows no evidence of aneurysm or dissection. No significant stenosis of the major arch vessel origins. Right carotid system: Normal right carotid without stenosis dissection or atherosclerotic disease. Left carotid system: Normal left carotid without stenosis dissection or atherosclerotic disease. Vertebral arteries: Both vertebral arteries widely patent to the basilar without stenosis. Skeleton: Negative Other neck: Mild thyroid enlargement.  No mass or adenopathy. Upper chest: Negative Review of the MIP images confirms the above findings CTA HEAD FINDINGS Anterior circulation: Cavernous carotid widely patent bilaterally. Anterior and middle cerebral arteries widely patent bilaterally without stenosis or  occlusion. Posterior circulation: Both vertebral arteries patent to the basilar. Basilar widely patent. Superior cerebellar and posterior cerebral arteries patent bilaterally without stenosis or occlusion. Venous sinuses: Normal venous enhancement. Anatomic variants: None Review of the MIP images confirms the above findings CT Brain Perfusion Findings: ASPECTS: 10 CBF (<30%) Volume: 72mL Perfusion (Tmax>6.0s) volume: 23mL Mismatch Volume: 20mL Infarction Location:None IMPRESSION: CT perfusion negative for infarct or ischemia Normal CTA head neck. No significant intracranial or extracranial stenosis. Negative for large vessel occlusion. These  results were called by telephone at the time of interpretation on 02/19/2019 at 2:07 pm to provider Dr. Cheral Marker, Who verbally acknowledged these results. Electronically Signed   By: Franchot Gallo M.D.   On: 02/19/2019 14:07   CT Code Stroke Cerebral Perfusion with contrast  Result Date: 02/19/2019 CLINICAL DATA:  Left-sided weakness.  Rule out stroke EXAM: CT ANGIOGRAPHY HEAD AND NECK CT PERFUSION BRAIN TECHNIQUE: Multidetector CT imaging of the head and neck was performed using the standard protocol during bolus administration of intravenous contrast. Multiplanar CT image reconstructions and MIPs were obtained to evaluate the vascular anatomy. Carotid stenosis measurements (when applicable) are obtained utilizing NASCET criteria, using the distal internal carotid diameter as the denominator. Multiphase CT imaging of the brain was performed following IV bolus contrast injection. Subsequent parametric perfusion maps were calculated using RAPID software. CONTRAST:  174mL OMNIPAQUE IOHEXOL 350 MG/ML SOLN COMPARISON:  CT head 02/19/2019 FINDINGS: CTA NECK FINDINGS Aortic arch: Standard branching. Imaged portion shows no evidence of aneurysm or dissection. No significant stenosis of the major arch vessel origins. Right carotid system: Normal right carotid without stenosis dissection  or atherosclerotic disease. Left carotid system: Normal left carotid without stenosis dissection or atherosclerotic disease. Vertebral arteries: Both vertebral arteries widely patent to the basilar without stenosis. Skeleton: Negative Other neck: Mild thyroid enlargement.  No mass or adenopathy. Upper chest: Negative Review of the MIP images confirms the above findings CTA HEAD FINDINGS Anterior circulation: Cavernous carotid widely patent bilaterally. Anterior and middle cerebral arteries widely patent bilaterally without stenosis or occlusion. Posterior circulation: Both vertebral arteries patent to the basilar. Basilar widely patent. Superior cerebellar and posterior cerebral arteries patent bilaterally without stenosis or occlusion. Venous sinuses: Normal venous enhancement. Anatomic variants: None Review of the MIP images confirms the above findings CT Brain Perfusion Findings: ASPECTS: 10 CBF (<30%) Volume: 48mL Perfusion (Tmax>6.0s) volume: 22mL Mismatch Volume: 45mL Infarction Location:None IMPRESSION: CT perfusion negative for infarct or ischemia Normal CTA head neck. No significant intracranial or extracranial stenosis. Negative for large vessel occlusion. These results were called by telephone at the time of interpretation on 02/19/2019 at 2:07 pm to provider Dr. Cheral Marker, Who verbally acknowledged these results. Electronically Signed   By: Franchot Gallo M.D.   On: 02/19/2019 14:07   CT HEAD CODE STROKE WO CONTRAST  Result Date: 02/19/2019 CLINICAL DATA:  Code stroke.  Left-sided weakness EXAM: CT HEAD WITHOUT CONTRAST TECHNIQUE: Contiguous axial images were obtained from the base of the skull through the vertex without intravenous contrast. COMPARISON:  None. FINDINGS: Brain: There is no acute intracranial hemorrhage, mass effect, or edema. There is no new loss gray-white differentiation. Ventricles and sulci are normal in size and configuration. There is no extra-axial fluid collection. Vascular: No  hyperdense vessel. Skull: Unremarkable. Sinuses/Orbits: Aerated.  Orbits are unremarkable. Other: Mastoid air cells are clear. ASPECTS ([place] Stroke Program Early CT Score) - Ganglionic level infarction (caudate, lentiform nuclei, internal capsule, insula, M1-M3 cortex): 7 - Supraganglionic infarction (M4-M6 cortex): 3 Total score (0-10 with 10 being normal): 10 IMPRESSION: No acute intracranial hemorrhage or evidence of acute infarction. ASPECT score is 10. These results were communicated to Dr. Cheral Marker at 1:39 pmon 2/5/2021by text page via the [facility] messaging system. Electronically Signed   By: Macy Mis M.D.   On: 02/19/2019 13:42    Procedures Procedures (including critical care time)  Medications Ordered in ED Medications  sodium chloride flush (NS) 0.9 % injection 3 mL (has no administration in time range)  LORazepam (  ATIVAN) 2 MG/ML injection (has no administration in time range)  iohexol (OMNIPAQUE) 350 MG/ML injection 100 mL (100 mLs Intravenous Contrast Given 02/19/19 1354)    ED Course  I have reviewed the triage vital signs and the nursing notes.  Pertinent labs & imaging results that were available during my care of the patient were reviewed by me and considered in my medical decision making (see chart for details). 2:34 PM Patient awake and alert Endorses prior seizure last November after surgery Neuro exam normal     MDM Rules/Calculators/A&P                      Patient presented today as code stroke but had witnessed seizure by EMS.  Seizure activity preceded the lateralized weakness. Left-sided weakness has resolved.  She had a another seizure while here in the ED witnessed by Dr. Cheral Marker. Patient given 1 g Keppra here.  Plan outpatient Keppra with referral to neurology.  Discussed seizure precautions with her and she voices understanding. Discussed with Dr. Regenia Skeeter and he will follow up and admit if continued weakness or discharge if symptoms resolved.  Final  Clinical Impression(s) / ED Diagnoses Final diagnoses:  Seizure ([place])  Left-sided weakness    Rx / DC Orders ED Discharge Orders    None       Pattricia Boss, MD 02/19/19 1600    Pattricia Boss, MD 02/19/19 1645    Pattricia Boss, MD 02/19/19 [phone]

## 2019-02-19 NOTE — Consult Note (Addendum)
NEURO HOSPITALIST  CONSULT   Requesting Physician: Dr. Jeanell Sparrow    Chief Complaint:  Left side weakness and facial droop   History obtained from:  EMS and Patient  HPI:                                                                                                                                         Madison Armstrong is a 54 y.o. female with a PMHx of DM who presented to the [facility] ED as a code stroke for c/c of left sided facial droop and left sided weakness following a seizure-like episode witnessed by EMS after they were called to her vehicle to evaluate a presyncopal spell.    Per EMS: patient called from the car. She had to pull over because she was not feeling well and felt like she was going to faint. She was able to park her car backwards in a parking lot. When EMS arrived she was able to get out of the car, talk and walk to stretcher. Once she was in the ambulance, at about 1246, she had a witnessed GTC seizure that lasted about 20 seconds. She was confused and drowsy for a short period of time afterwards, during and after which she also had speech difficulty and left sided weakness.  Patient did vomit in the ambulance. In CT patient had another witnessed seizure like episode that lasted about 30 seconds. Eyes were closed and head was shaking with low-amplitude, tremulous movements. Patient had about approximately 2 non-rhythmic episodes of sustained flexion at the hip and neck during the spell. Her arms, legs and abdomen contracted in what appeared to be a controlled manner. No large amplitude rhythmic movements, unilateral manifestations, head turning, gaze deviation, tongue bite, drooling or incontinence occurred. 10 seconds after this episode the patient was able to follow commands and respond to questions, but her speech was hypophonic. An episode of non-physiological eye-rolling was also noted following the spell. The patient later states that she  had a seizure in November, with no prior episodes during her lifetime.    ED course:  CTH: No hemorrhage 2mg  Ativan given in CT CTP: Negative  CTA: no LVO  Date last known well: 02/19/19 Time last known well: 1145 tPA Given: No; low suspicion for stroke, seizure the most likely etiology for her presentation Modified Rankin: Rankin Score=0 NIHSS: 6   Past Medical History:  Diagnosis Date  . Chicken pox   . Diabetes mellitus without complication [facility])     Past Surgical History:  Procedure Laterality Date  . ABDOMINAL HYSTERECTOMY    . COLONOSCOPY WITH PROPOFOL N/A 08/31/2014  Procedure: COLONOSCOPY WITH PROPOFOL;  Surgeon: Manya Silvas, MD;  Location: [facility] ENDOSCOPY;  Service: Endoscopy;  Laterality: N/A;  . ESOPHAGOGASTRODUODENOSCOPY (EGD) WITH PROPOFOL N/A 08/31/2014   Procedure: ESOPHAGOGASTRODUODENOSCOPY (EGD) WITH PROPOFOL;  Surgeon: Manya Silvas, MD;  Location: [facility] ENDOSCOPY;  Service: Endoscopy;  Laterality: N/A;  . nipple removal      Family History  Problem Relation Age of Onset  . Stroke Mother   . Hypertension Mother   . Hypertension Father   . Ovarian cancer Maternal Grandmother   . Stroke Maternal Grandfather   . Throat cancer Paternal Grandmother   . Heart disease Paternal Grandfather   . Heart disease Brother        Passed away at 80     Social History:  reports that she has never smoked. She has never used smokeless tobacco. She reports that she does not drink alcohol or use drugs.  Allergies: No Known Allergies  Medications:                                                                                                                          Current Facility-Administered Medications  Medication Dose Route Frequency Provider Last Rate Last Admin  . LORazepam (ATIVAN) 2 MG/ML injection           . sodium chloride flush (NS) 0.9 % injection 3 mL  3 mL Intravenous Once Pattricia Boss, MD       Current Outpatient Medications  Medication Sig  Dispense Refill  . cyclobenzaprine (FLEXERIL) 5 MG tablet Take 1 tablet (5 mg total) by mouth 3 (three) times daily as needed for muscle spasms. 30 tablet 0  . meclizine (ANTIVERT) 25 MG tablet Take 25 mg by mouth 3 (three) times daily as needed for dizziness.    Marland Kitchen omeprazole (PRILOSEC) 40 MG capsule Take 40 mg by mouth daily.    . ondansetron (ZOFRAN ODT) 4 MG disintegrating tablet Take 1 tablet (4 mg total) by mouth every 8 (eight) hours as needed for nausea or vomiting. 20 tablet 0     ROS:                                                                                                                                       ROS was performed and is negative except as noted in HPI    General Examination:  Weight 127 kg.  Physical Exam  Constitutional: Appears well-developed and well-nourished.  Psych: Affect appropriate to situation Eyes: Normal external eye and conjunctiva. HENT: Normocephalic, no lesions, without obvious abnormality.   Musculoskeletal-no joint tenderness, deformity or swelling Cardiovascular: Normal rate and regular rhythm.  Respiratory: Effort normal, non-labored breathing saturations WNL GI: Soft.  No distension. There is no tenderness.  Skin: WDI  Neurological Examination Mental Status: Drowsy affect, hypophonic speech. No dysarthria noted. Speech fluent without evidence of aphasia.  Able to name thumb, unable to name her ear and index finger. Able to follow some motor commands without difficulty (questionable effort). Cranial Nerves: II:  Visual fields grossly normal. PERRL III,IV, VI: ptosis not present, EOMI - embellished non-physiological rolling eye movements also noted  V,VII: face symmetric at baseline, but when asked to smile the first time she had a slight facial droop on right side; on second smile after a delay her facial droop was on the left side.  Facial muscles were tensed not weakened in the context of the asymmetry, which appeared to be due to asymmetric contraction rather than weakness on one side. Facial light touch sensation normal bilaterally VIII: hearing intact to voice IX,X: Palate rises midline XI: Symmetric shoulder shrug XII: midline tongue extension Motor: Right : Upper extremity   4+/5  Left:     Upper extremity   2/5  Lower extremity   4+/5  Lower extremity   4+/5 Tone and bulk: Normal tone throughout; no atrophy noted Sensory: Sensation subjectively decreased to left arm Normal sensation to LT in BLE and RUE Deep Tendon Reflexes: 2+ and symmetric biceps and patellae Cerebellar: No ataxia noted with movements Gait: Deferred   Lab Results: Basic Metabolic Panel: Recent Labs  Lab 02/19/19 1330  NA 138  K 2.9*  CL 101  GLUCOSE 140*  BUN 12  CREATININE 0.80    CBC: Recent Labs  Lab 02/19/19 1326 02/19/19 1330  WBC 13.0*  --   NEUTROABS 9.1*  --   HGB 12.2 13.3  HCT 38.1 39.0  MCV 94.5  --   PLT 329  --      CBG: Recent Labs  Lab 02/19/19 1325  GLUCAP 131*    Imaging: CT Code Stroke CTA Head W/WO contrast  Result Date: 02/19/2019 CLINICAL DATA:  Left-sided weakness.  Rule out stroke EXAM: CT ANGIOGRAPHY HEAD AND NECK CT PERFUSION BRAIN TECHNIQUE: Multidetector CT imaging of the head and neck was performed using the standard protocol during bolus administration of intravenous contrast. Multiplanar CT image reconstructions and MIPs were obtained to evaluate the vascular anatomy. Carotid stenosis measurements (when applicable) are obtained utilizing NASCET criteria, using the distal internal carotid diameter as the denominator. Multiphase CT imaging of the brain was performed following IV bolus contrast injection. Subsequent parametric perfusion maps were calculated using RAPID software. CONTRAST:  14mL OMNIPAQUE IOHEXOL 350 MG/ML SOLN COMPARISON:  CT head 02/19/2019 FINDINGS: CTA NECK FINDINGS  Aortic arch: Standard branching. Imaged portion shows no evidence of aneurysm or dissection. No significant stenosis of the major arch vessel origins. Right carotid system: Normal right carotid without stenosis dissection or atherosclerotic disease. Left carotid system: Normal left carotid without stenosis dissection or atherosclerotic disease. Vertebral arteries: Both vertebral arteries widely patent to the basilar without stenosis. Skeleton: Negative Other neck: Mild thyroid enlargement.  No mass or adenopathy. Upper chest: Negative Review of the MIP images confirms the above findings CTA HEAD FINDINGS Anterior circulation: Cavernous carotid widely patent bilaterally. Anterior and middle cerebral arteries  widely patent bilaterally without stenosis or occlusion. Posterior circulation: Both vertebral arteries patent to the basilar. Basilar widely patent. Superior cerebellar and posterior cerebral arteries patent bilaterally without stenosis or occlusion. Venous sinuses: Normal venous enhancement. Anatomic variants: None Review of the MIP images confirms the above findings CT Brain Perfusion Findings: ASPECTS: 10 CBF (<30%) Volume: 64mL Perfusion (Tmax>6.0s) volume: 73mL Mismatch Volume: 67mL Infarction Location:None IMPRESSION: CT perfusion negative for infarct or ischemia Normal CTA head neck. No significant intracranial or extracranial stenosis. Negative for large vessel occlusion. These results were called by telephone at the time of interpretation on 02/19/2019 at 2:07 pm to provider Dr. Cheral Marker, Who verbally acknowledged these results. Electronically Signed   By: Franchot Gallo M.D.   On: 02/19/2019 14:07   CT Code Stroke CTA Neck W/WO contrast  Result Date: 02/19/2019 CLINICAL DATA:  Left-sided weakness.  Rule out stroke EXAM: CT ANGIOGRAPHY HEAD AND NECK CT PERFUSION BRAIN TECHNIQUE: Multidetector CT imaging of the head and neck was performed using the standard protocol during bolus administration of  intravenous contrast. Multiplanar CT image reconstructions and MIPs were obtained to evaluate the vascular anatomy. Carotid stenosis measurements (when applicable) are obtained utilizing NASCET criteria, using the distal internal carotid diameter as the denominator. Multiphase CT imaging of the brain was performed following IV bolus contrast injection. Subsequent parametric perfusion maps were calculated using RAPID software. CONTRAST:  122mL OMNIPAQUE IOHEXOL 350 MG/ML SOLN COMPARISON:  CT head 02/19/2019 FINDINGS: CTA NECK FINDINGS Aortic arch: Standard branching. Imaged portion shows no evidence of aneurysm or dissection. No significant stenosis of the major arch vessel origins. Right carotid system: Normal right carotid without stenosis dissection or atherosclerotic disease. Left carotid system: Normal left carotid without stenosis dissection or atherosclerotic disease. Vertebral arteries: Both vertebral arteries widely patent to the basilar without stenosis. Skeleton: Negative Other neck: Mild thyroid enlargement.  No mass or adenopathy. Upper chest: Negative Review of the MIP images confirms the above findings CTA HEAD FINDINGS Anterior circulation: Cavernous carotid widely patent bilaterally. Anterior and middle cerebral arteries widely patent bilaterally without stenosis or occlusion. Posterior circulation: Both vertebral arteries patent to the basilar. Basilar widely patent. Superior cerebellar and posterior cerebral arteries patent bilaterally without stenosis or occlusion. Venous sinuses: Normal venous enhancement. Anatomic variants: None Review of the MIP images confirms the above findings CT Brain Perfusion Findings: ASPECTS: 10 CBF (<30%) Volume: 13mL Perfusion (Tmax>6.0s) volume: 33mL Mismatch Volume: 8mL Infarction Location:None IMPRESSION: CT perfusion negative for infarct or ischemia Normal CTA head neck. No significant intracranial or extracranial stenosis. Negative for large vessel occlusion. These  results were called by telephone at the time of interpretation on 02/19/2019 at 2:07 pm to provider Dr. Cheral Marker, Who verbally acknowledged these results. Electronically Signed   By: Franchot Gallo M.D.   On: 02/19/2019 14:07   CT Code Stroke Cerebral Perfusion with contrast  Result Date: 02/19/2019 CLINICAL DATA:  Left-sided weakness.  Rule out stroke EXAM: CT ANGIOGRAPHY HEAD AND NECK CT PERFUSION BRAIN TECHNIQUE: Multidetector CT imaging of the head and neck was performed using the standard protocol during bolus administration of intravenous contrast. Multiplanar CT image reconstructions and MIPs were obtained to evaluate the vascular anatomy. Carotid stenosis measurements (when applicable) are obtained utilizing NASCET criteria, using the distal internal carotid diameter as the denominator. Multiphase CT imaging of the brain was performed following IV bolus contrast injection. Subsequent parametric perfusion maps were calculated using RAPID software. CONTRAST:  127mL OMNIPAQUE IOHEXOL 350 MG/ML SOLN COMPARISON:  CT head 02/19/2019 FINDINGS:  CTA NECK FINDINGS Aortic arch: Standard branching. Imaged portion shows no evidence of aneurysm or dissection. No significant stenosis of the major arch vessel origins. Right carotid system: Normal right carotid without stenosis dissection or atherosclerotic disease. Left carotid system: Normal left carotid without stenosis dissection or atherosclerotic disease. Vertebral arteries: Both vertebral arteries widely patent to the basilar without stenosis. Skeleton: Negative Other neck: Mild thyroid enlargement.  No mass or adenopathy. Upper chest: Negative Review of the MIP images confirms the above findings CTA HEAD FINDINGS Anterior circulation: Cavernous carotid widely patent bilaterally. Anterior and middle cerebral arteries widely patent bilaterally without stenosis or occlusion. Posterior circulation: Both vertebral arteries patent to the basilar. Basilar widely patent.  Superior cerebellar and posterior cerebral arteries patent bilaterally without stenosis or occlusion. Venous sinuses: Normal venous enhancement. Anatomic variants: None Review of the MIP images confirms the above findings CT Brain Perfusion Findings: ASPECTS: 10 CBF (<30%) Volume: 53mL Perfusion (Tmax>6.0s) volume: 88mL Mismatch Volume: 70mL Infarction Location:None IMPRESSION: CT perfusion negative for infarct or ischemia Normal CTA head neck. No significant intracranial or extracranial stenosis. Negative for large vessel occlusion. These results were called by telephone at the time of interpretation on 02/19/2019 at 2:07 pm to provider Dr. Cheral Marker, Who verbally acknowledged these results. Electronically Signed   By: Franchot Gallo M.D.   On: 02/19/2019 14:07   CT HEAD CODE STROKE WO CONTRAST  Result Date: 02/19/2019 CLINICAL DATA:  Code stroke.  Left-sided weakness EXAM: CT HEAD WITHOUT CONTRAST TECHNIQUE: Contiguous axial images were obtained from the base of the skull through the vertex without intravenous contrast. COMPARISON:  None. FINDINGS: Brain: There is no acute intracranial hemorrhage, mass effect, or edema. There is no new loss gray-white differentiation. Ventricles and sulci are normal in size and configuration. There is no extra-axial fluid collection. Vascular: No hyperdense vessel. Skull: Unremarkable. Sinuses/Orbits: Aerated.  Orbits are unremarkable. Other: Mastoid air cells are clear. ASPECTS ([place] Stroke Program Early CT Score) - Ganglionic level infarction (caudate, lentiform nuclei, internal capsule, insula, M1-M3 cortex): 7 - Supraganglionic infarction (M4-M6 cortex): 3 Total score (0-10 with 10 being normal): 10 IMPRESSION: No acute intracranial hemorrhage or evidence of acute infarction. ASPECT score is 10. These results were communicated to Dr. Cheral Marker at 1:39 pmon 2/5/2021by text page via the [facility] messaging system. Electronically Signed   By: Macy Mis M.D.   On: 02/19/2019 13:42      EEG 02/19/19 IMPRESSION: This study is within normal limits. However, only wakefulness and drowsiness were recorded. If suspicion for interictal activity remains a concern, a prolonged study including sleep should be considered. No seizures or epileptiform discharges were seen throughout the recording. The excessive beta activity seen in the background is most likely due to the effect of benzodiazepine and is a benign EEG pattern.  Laurey Morale, MSN, NP-C [facility] [phone] 02/19/2019, 1:31 PM    Assessment: 54 y.o. female with a PMHx of DM who presents to the ED as a code stroke for slurred speech and left sided weakness following a seizure like episode. Code stroke later canceled. CTH did not show a hemorrhage, the CTA did not reveal an LVO or evidence for significant atherosclerotic disease. TPA was not given d/t low suspicion for stroke. Not an IR candidate due to no LVO. No seizures or epileptiform discharges were seen on EEG.  1. Parts of patient exam appear embellished with decreased effort. All 4 extremities have drift, but able to lift anti-gravity. Facial droop fluctuates from left to right.  2. Witnessed during her exam was a 30 second seizure like episode in which patient flexed at the hip, and flexed at the neck, was nonverbal and with eyes closed and mouth pursed closed. 10 seconds after the episode ended she was able to follow most commands and answer questions, which would be an atypical duration for postictal state. . 3. No seizures or epileptiform discharges were seen on EEG.  4. Stroke Risk Factors - diabetes mellitus 5. Overall impression: Epileptic seizures vs psychogenic pseudoseizures   Recommendations: -- Although semiology of her spell in the ED was atypical, EMS felt that the event en route was overall most consistent with an epileptic seizure. She also endorses having had the first seizure of her lifetime in November.  -- Epileptic seizure is not  a definitive diagnosis but is of high enough likelihood to warrant starting Keppra and obtaining an outpatient Neurology follow up for MRI, repeat EEG and repeat clinical exam. Recommend loading 1000 mg IV Keppra followed by 500 mg PO BID.   -- Per [facility] statutes, patients with seizures are not allowed to drive until  they have been seizure-free for six months. Use caution when using heavy equipment or power tools. Avoid working on ladders or at heights. Take showers instead of baths. Ensure the water temperature is not too high on the home water heater. Do not go swimming alone. When caring for infants or small children, sit down when holding, feeding, or changing them to minimize risk of injury to the child in the event you have a seizure. Also, Maintain good sleep hygiene. Avoid alcohol.  I have seen and examined the patient. I have formulated the assessment and recommendations. 54 year old female with seizure versus pseudoseizure, followed by transient weakness and apparent confusion that rapidly resolve. Work up as documented above. Starting Keppra. Will need outpatient neurology follow up. Should not drive until seizure free for 6 months.  Electronically signed: Dr. Kerney Elbe

## 2019-02-19 NOTE — Code Documentation (Addendum)
54 yo female coming from parking lot where she was parked when found. Pt reports that she was at her baseline until 1145 on her way to work. Onset of dizziness and nausea. She pulled over and backed into a parking spot before she called her husband and reported unable to speak. Husband noted that she was not normal and called EMS. EMS arrived and found patient in the car. Pt was able to walk to stretcher. While in stretcher, EMS reported 20 second full body tremors. Pt noted to be drowsy and talking with paramedics after episode complaining of left sided weakness and sensory decreased. At 1312, paramedic reports pt being unable to speak. Code Stroke activated. Stroke Team met patient upon arrival to the ED. Labs drawn and EDP cleared airway. Pt taken to CT. While on the table, pt had full body tremors that last for 30 seconds. 2 mg of Ativan given per MD Lindzen. CT Head completed. No acute abnormalcy noted. 20 gauge Diffusics IV placed on the left AC for CTA/CTP. Imaging completed. Pt brought back to the room and placed on cardiac monitor. Exam is noted to be changing and patient has more strength in the left side. Continues to subjectively report not being able to feel in the left arm. Pt not given tPA due to stroke not suspected. Not an IR candidate due to no LVO on CTA. Pt to have q1 hour mNIHSS and STAT EEG. Handoff given to [place], Therapist, sports.

## 2019-02-19 NOTE — ED Provider Notes (Signed)
Care transferred to me. Patient couldn't even stand up with assistance at bedside. Developed dizziness, nausea, weakness. Unclear if this is still related to seizure, medication, hypokalemia, etc. Will get MRI, treat as vertigo which she has had before.   MRI returns with no acute process.  After this, she was unable to stand up due to dizziness again and so I discussed with her and we will admit.  However while awaiting admission she got up again and was able to walk to the bathroom.  Felt dizzy but was able to walk and wants to go home.  I think this is likely a peripheral vertigo and commendation with what ever her presentation was earlier.  I think she is stable for discharge home.  Will replace potassium at home.   Sherwood Gambler, MD 02/19/19 [phone]

## 2019-02-19 NOTE — Procedures (Signed)
Patient Name: Madison Armstrong  MRN: [id number]  Epilepsy Attending: Lora Havens  Referring Physician/Provider: Laurey Morale, NP Date: 02/19/2019 Duration: 28.23 mins  Patient history: 54 year old female presented to ED with complaints of slurred speech and left-sided weakness following a seizure-like episode.  EEG to evaluate for seizures.  Level of alertness: Awake, asleep  AEDs during EEG study: Lorazepam  Technical aspects: This EEG study was done with scalp electrodes positioned according to the 10-20 International system of electrode placement. Electrical activity was acquired at a sampling rate of 500Hz  and reviewed with a high frequency filter of 70Hz  and a low frequency filter of 1Hz . EEG data were recorded continuously and digitally stored.   Description The posterior dominant rhythm consists of 9-10 Hz activity of moderate voltage (25-35 uV) seen predominantly in posterior head regions, symmetric and reactive to eye opening and eye closing. Sleep was characterized by intermittent generalized 2-3Hz  delta slowing.           Physiologic photic driving was seen during photic stimulation. HV was not performed.  Multiple episodes of brief head and arm jerking were recorded. Concomitant eeg before, during and after these events didn't show any eeg change to suggest seizure.  IMPRESSION: This study is within normal limits. No seizures or epileptiform discharges were seen throughout the recording.  Multiple episodes of head and arm jerking were recorded without EEG change and were likely non epileptic.   Madison Armstrong Madison Armstrong

## 2019-02-19 NOTE — ED Triage Notes (Signed)
Pt BIB [facility] w/ c/o aphasia and left sided weakness. Pt was driving when she started feeling dizzy/diaphoretic around 1145. Pt pulled over and called her husband who called EMS. Pt had witnessed seizure activity per EMS at 1245. Pt had another seizure while in CT at approx 1335, received 2 mg ativan. Pt experiencing L sensory deficits, aphasia improving at this time. Pt alert to voice, oriented x4. NAD. BP: 118/63 O2: 94% RA HR: 91 RR: 15

## 2019-02-19 NOTE — Progress Notes (Signed)
EEG completed, results pending. 

## 2019-02-19 NOTE — ED Notes (Signed)
Attempted to ambulate patient In the hallway. Once patient sat up she stated that she was to dizzy to walk and wanted to lay back down. Patient also stated her legs were very weak.

## 2019-02-19 NOTE — Discharge Instructions (Addendum)
Per [facility] statutes, patients with seizures are not allowed to drive until they have been seizure-free for six months. Use caution when using heavy equipment or power tools. Avoid working on ladders or at heights. Take showers instead of baths. Ensure the water temperature is not too high on the home water heater. Do not go swimming alone. Do not lock yourself in a room alone (i.e. bathroom). When caring for infants or small children, sit down when holding, feeding, or changing them to minimize risk of injury to the child in the event you have a seizure. Maintain good sleep hygiene. Avoid alcohol.    If Madison Armstrong has another seizure, call 911 and bring them back to the ED if:       A.  The seizure lasts longer than 5 minutes.            B.  The patient doesn't wake shortly after the seizure or has new problems such as difficulty seeing, speaking or moving following the seizure       C.  The patient was injured during the seizure       D.  The patient has a temperature over 102 F (39C)       E.  The patient vomited during the seizure and now is having trouble breathing

## 2019-02-22 DIAGNOSIS — Z79899 Other long term (current) drug therapy: Secondary | ICD-10-CM | POA: Diagnosis not present

## 2019-02-22 DIAGNOSIS — E872 Acidosis: Secondary | ICD-10-CM | POA: Diagnosis not present

## 2019-02-22 DIAGNOSIS — R569 Unspecified convulsions: Secondary | ICD-10-CM | POA: Diagnosis not present

## 2019-02-22 DIAGNOSIS — Z20822 Contact with and (suspected) exposure to covid-19: Secondary | ICD-10-CM | POA: Diagnosis not present

## 2019-02-22 DIAGNOSIS — R110 Nausea: Secondary | ICD-10-CM | POA: Diagnosis not present

## 2019-02-22 DIAGNOSIS — Z5181 Encounter for therapeutic drug level monitoring: Secondary | ICD-10-CM | POA: Diagnosis not present

## 2019-03-01 DIAGNOSIS — F0789 Other personality and behavioral disorders due to known physiological condition: Secondary | ICD-10-CM | POA: Diagnosis not present

## 2019-03-01 DIAGNOSIS — R569 Unspecified convulsions: Secondary | ICD-10-CM | POA: Diagnosis not present

## 2019-03-01 DIAGNOSIS — F09 Unspecified mental disorder due to known physiological condition: Secondary | ICD-10-CM | POA: Diagnosis not present

## 2019-03-03 DIAGNOSIS — M25661 Stiffness of right knee, not elsewhere classified: Secondary | ICD-10-CM | POA: Diagnosis not present

## 2019-03-03 DIAGNOSIS — M25561 Pain in right knee: Secondary | ICD-10-CM | POA: Diagnosis not present

## 2019-03-03 DIAGNOSIS — M6281 Muscle weakness (generalized): Secondary | ICD-10-CM | POA: Diagnosis not present

## 2019-03-03 DIAGNOSIS — M2408 Loose body, other site: Secondary | ICD-10-CM | POA: Diagnosis not present

## 2019-03-08 DIAGNOSIS — M533 Sacrococcygeal disorders, not elsewhere classified: Secondary | ICD-10-CM | POA: Diagnosis not present

## 2019-03-08 DIAGNOSIS — F445 Conversion disorder with seizures or convulsions: Secondary | ICD-10-CM | POA: Insufficient documentation

## 2019-03-08 DIAGNOSIS — M25561 Pain in right knee: Secondary | ICD-10-CM | POA: Diagnosis not present

## 2019-03-08 DIAGNOSIS — M25661 Stiffness of right knee, not elsewhere classified: Secondary | ICD-10-CM | POA: Diagnosis not present

## 2019-03-08 DIAGNOSIS — M2408 Loose body, other site: Secondary | ICD-10-CM | POA: Diagnosis not present

## 2019-03-08 DIAGNOSIS — M6281 Muscle weakness (generalized): Secondary | ICD-10-CM | POA: Diagnosis not present

## 2019-03-13 ENCOUNTER — Ambulatory Visit: Payer: BC Managed Care – PPO | Attending: Internal Medicine

## 2019-03-13 DIAGNOSIS — Z23 Encounter for immunization: Secondary | ICD-10-CM | POA: Insufficient documentation

## 2019-03-13 NOTE — Progress Notes (Signed)
   Covid-19 Vaccination Clinic  Name:  Madison Armstrong    MRN: [id number] DOB: Sep 19, 1965  03/13/2019  [name] was observed post Covid-19 immunization for 15 minutes without incidence. She was provided with Vaccine Information Sheet and instruction to access the V-Safe system.   [name] was instructed to call 911 with any severe reactions post vaccine: Marland Kitchen Difficulty breathing  . Swelling of your face and throat  . A fast heartbeat  . A bad rash all over your body  . Dizziness and weakness    Immunizations Administered    Name Date Dose VIS Date Route   Moderna COVID-19 Vaccine 03/13/2019 11:49 AM 0.5 mL 12/15/2018 Intramuscular   Manufacturer: Moderna   Lot: [id number]   [id number]

## 2019-03-17 DIAGNOSIS — R569 Unspecified convulsions: Secondary | ICD-10-CM | POA: Diagnosis not present

## 2019-03-18 DIAGNOSIS — M25661 Stiffness of right knee, not elsewhere classified: Secondary | ICD-10-CM | POA: Diagnosis not present

## 2019-03-18 DIAGNOSIS — M2408 Loose body, other site: Secondary | ICD-10-CM | POA: Diagnosis not present

## 2019-03-18 DIAGNOSIS — R569 Unspecified convulsions: Secondary | ICD-10-CM | POA: Diagnosis not present

## 2019-03-18 DIAGNOSIS — M25561 Pain in right knee: Secondary | ICD-10-CM | POA: Diagnosis not present

## 2019-03-18 DIAGNOSIS — M6281 Muscle weakness (generalized): Secondary | ICD-10-CM | POA: Diagnosis not present

## 2019-03-22 DIAGNOSIS — M533 Sacrococcygeal disorders, not elsewhere classified: Secondary | ICD-10-CM | POA: Diagnosis not present

## 2019-03-30 DIAGNOSIS — M533 Sacrococcygeal disorders, not elsewhere classified: Secondary | ICD-10-CM | POA: Diagnosis not present

## 2019-04-01 DIAGNOSIS — N951 Menopausal and female climacteric states: Secondary | ICD-10-CM | POA: Diagnosis not present

## 2019-04-01 DIAGNOSIS — Z1231 Encounter for screening mammogram for malignant neoplasm of breast: Secondary | ICD-10-CM | POA: Diagnosis not present

## 2019-04-01 DIAGNOSIS — Z01419 Encounter for gynecological examination (general) (routine) without abnormal findings: Secondary | ICD-10-CM | POA: Diagnosis not present

## 2019-04-10 ENCOUNTER — Ambulatory Visit: Payer: BC Managed Care – PPO | Attending: Internal Medicine

## 2019-04-10 DIAGNOSIS — Z23 Encounter for immunization: Secondary | ICD-10-CM

## 2019-04-10 NOTE — Progress Notes (Signed)
   Covid-19 Vaccination Clinic  Name:  Madison Armstrong    MRN: [id number] DOB: 08/20/1965  04/10/2019  [name] was observed post Covid-19 immunization for 15 minutes without incident. She was provided with Vaccine Information Sheet and instruction to access the V-Safe system.   [name] was instructed to call 911 with any severe reactions post vaccine: Marland Kitchen Difficulty breathing  . Swelling of face and throat  . A fast heartbeat  . A bad rash all over body  . Dizziness and weakness   Immunizations Administered    Name Date Dose VIS Date Route   Moderna COVID-19 Vaccine 04/10/2019 11:47 AM 0.5 mL 12/15/2018 Intramuscular   Manufacturer: Levan Hurst   Lot: [id number]   [place]: [id number]

## 2019-04-18 DIAGNOSIS — H6692 Otitis media, unspecified, left ear: Secondary | ICD-10-CM | POA: Diagnosis not present

## 2019-04-27 DIAGNOSIS — H9202 Otalgia, left ear: Secondary | ICD-10-CM | POA: Diagnosis not present

## 2019-04-27 DIAGNOSIS — H6063 Unspecified chronic otitis externa, bilateral: Secondary | ICD-10-CM | POA: Diagnosis not present

## 2019-04-27 DIAGNOSIS — H93292 Other abnormal auditory perceptions, left ear: Secondary | ICD-10-CM | POA: Diagnosis not present

## 2019-04-30 DIAGNOSIS — Z6835 Body mass index (BMI) 35.0-35.9, adult: Secondary | ICD-10-CM | POA: Diagnosis not present

## 2019-04-30 DIAGNOSIS — E6609 Other obesity due to excess calories: Secondary | ICD-10-CM | POA: Diagnosis not present

## 2019-04-30 DIAGNOSIS — M533 Sacrococcygeal disorders, not elsewhere classified: Secondary | ICD-10-CM | POA: Diagnosis not present

## 2019-04-30 DIAGNOSIS — M2408 Loose body, other site: Secondary | ICD-10-CM | POA: Diagnosis not present

## 2019-05-03 DIAGNOSIS — G8929 Other chronic pain: Secondary | ICD-10-CM | POA: Diagnosis not present

## 2019-05-03 DIAGNOSIS — M533 Sacrococcygeal disorders, not elsewhere classified: Secondary | ICD-10-CM | POA: Diagnosis not present

## 2019-05-25 DIAGNOSIS — H60542 Acute eczematoid otitis externa, left ear: Secondary | ICD-10-CM | POA: Diagnosis not present

## 2019-05-31 DIAGNOSIS — R4689 Other symptoms and signs involving appearance and behavior: Secondary | ICD-10-CM | POA: Diagnosis not present

## 2019-06-17 DIAGNOSIS — M545 Low back pain: Secondary | ICD-10-CM | POA: Diagnosis not present

## 2019-06-17 DIAGNOSIS — M533 Sacrococcygeal disorders, not elsewhere classified: Secondary | ICD-10-CM | POA: Diagnosis not present

## 2019-06-17 DIAGNOSIS — G8929 Other chronic pain: Secondary | ICD-10-CM | POA: Diagnosis not present

## 2019-06-30 DIAGNOSIS — G8929 Other chronic pain: Secondary | ICD-10-CM | POA: Diagnosis not present

## 2019-06-30 DIAGNOSIS — M545 Low back pain: Secondary | ICD-10-CM | POA: Diagnosis not present

## 2019-06-30 DIAGNOSIS — M533 Sacrococcygeal disorders, not elsewhere classified: Secondary | ICD-10-CM | POA: Diagnosis not present

## 2019-07-02 DIAGNOSIS — M545 Low back pain: Secondary | ICD-10-CM | POA: Diagnosis not present

## 2019-07-02 DIAGNOSIS — G8929 Other chronic pain: Secondary | ICD-10-CM | POA: Diagnosis not present

## 2019-07-12 DIAGNOSIS — R030 Elevated blood-pressure reading, without diagnosis of hypertension: Secondary | ICD-10-CM | POA: Diagnosis not present

## 2019-07-13 DIAGNOSIS — M545 Low back pain: Secondary | ICD-10-CM | POA: Diagnosis not present

## 2019-07-13 DIAGNOSIS — R262 Difficulty in walking, not elsewhere classified: Secondary | ICD-10-CM | POA: Diagnosis not present

## 2019-07-13 DIAGNOSIS — M6281 Muscle weakness (generalized): Secondary | ICD-10-CM | POA: Diagnosis not present

## 2019-07-13 DIAGNOSIS — M2560 Stiffness of unspecified joint, not elsewhere classified: Secondary | ICD-10-CM | POA: Diagnosis not present

## 2019-11-09 DIAGNOSIS — N762 Acute vulvitis: Secondary | ICD-10-CM | POA: Diagnosis not present

## 2019-11-09 DIAGNOSIS — N898 Other specified noninflammatory disorders of vagina: Secondary | ICD-10-CM | POA: Diagnosis not present

## 2019-11-19 ENCOUNTER — Ambulatory Visit
Admission: RE | Admit: 2019-11-19 | Discharge: 2019-11-19 | Disposition: A | Discharge status: Home / Self Care | Payer: BC Managed Care – PPO | Source: Ambulatory Visit | Attending: Emergency Medicine | Admitting: Emergency Medicine

## 2019-11-19 ENCOUNTER — Other Ambulatory Visit: Payer: Self-pay

## 2019-11-19 VITALS — BP 129/86 | HR 77 | Temp 98.5°F | Resp 16

## 2019-11-19 DIAGNOSIS — J069 Acute upper respiratory infection, unspecified: Secondary | ICD-10-CM

## 2019-11-19 DIAGNOSIS — Z1152 Encounter for screening for COVID-19: Secondary | ICD-10-CM

## 2019-11-19 DIAGNOSIS — J029 Acute pharyngitis, unspecified: Secondary | ICD-10-CM

## 2019-11-19 DIAGNOSIS — Z20828 Contact with and (suspected) exposure to other viral communicable diseases: Secondary | ICD-10-CM | POA: Diagnosis not present

## 2019-11-19 MED ORDER — FLUTICASONE PROPIONATE 50 MCG/ACT NA SUSP: 1.0000 | Freq: Every day | NASAL | 0 refills | Status: DC

## 2019-11-19 MED ORDER — BENZONATATE 100 MG PO CAPS: 100.0000 mg | ORAL_CAPSULE | Freq: Three times a day (TID) | ORAL | 0 refills | Status: DC

## 2019-11-19 MED ORDER — DEXAMETHASONE 4 MG PO TABS: 4.0000 mg | ORAL_TABLET | Freq: Every day | ORAL | 0 refills | Status: AC

## 2019-11-19 MED ORDER — CETIRIZINE HCL 10 MG PO TABS: 10.0000 mg | ORAL_TABLET | Freq: Every day | ORAL | 0 refills | Status: DC

## 2019-11-19 NOTE — Discharge Instructions (Addendum)
COVID testing ordered.  It will take between 2-7 days for test results.  Someone will contact you regarding abnormal results.    In the meantime: You should remain isolated in your home for 10 days from symptom onset AND greater than 24 hours after symptoms resolution (absence of fever without the use of fever-reducing medication and improvement in respiratory symptoms), whichever is longer Get plenty of rest and push fluids Tessalon Perles prescribed for cough Zzyrtec for nasal congestion, runny nose, and/or sore throat Flonase for nasal congestion and runny nose Decadron was prescribed Use medications daily for symptom relief Use OTC medications like ibuprofen or tylenol as needed fever or pain Call or go to the ED if you have any new or worsening symptoms such as fever, worsening cough, shortness of breath, chest tightness, chest pain, turning blue, changes in mental status, etc..Marland Kitchen

## 2019-11-19 NOTE — ED Provider Notes (Signed)
[place]   [id number] 11/19/19 Arrival Time: 3818   CC: COVID symptoms  SUBJECTIVE: History from: patient.  Madison Armstrong is a 54 y.o. female who presented to the urgent care for complaint of cough, nasal congestion and sore throat for the past 2 days.  Denies sick exposure to COVID, flu or strep.  Denies recent travel.  Has tried OTC medication without relief.  Denies aggravating factor.  Denies previous symptoms in the past.   Denies fever, chills, fatigue, sinus pain, rhinorrhea,  SOB, wheezing, chest pain, nausea, changes in bowel or bladder habits.    ROS: As per HPI.  All other pertinent ROS negative.      Past Medical History:  Diagnosis Date  . Chicken pox   . Diabetes mellitus without complication [facility])    Past Surgical History:  Procedure Laterality Date  . ABDOMINAL HYSTERECTOMY    . COLONOSCOPY WITH PROPOFOL N/A 08/31/2014   Procedure: COLONOSCOPY WITH PROPOFOL;  Surgeon: Manya Silvas, MD;  Location: [facility] ENDOSCOPY;  Service: Endoscopy;  Laterality: N/A;  . ESOPHAGOGASTRODUODENOSCOPY (EGD) WITH PROPOFOL N/A 08/31/2014   Procedure: ESOPHAGOGASTRODUODENOSCOPY (EGD) WITH PROPOFOL;  Surgeon: Manya Silvas, MD;  Location: [facility] ENDOSCOPY;  Service: Endoscopy;  Laterality: N/A;  . nipple removal     No Known Allergies No current facility-administered medications on file prior to encounter.   Current Outpatient Medications on File Prior to Encounter  Medication Sig Dispense Refill  . CELEBREX 200 MG capsule Take 200 mg by mouth 2 (two) times daily.    . Cholecalciferol 125 MCG (5000 UT) TABS Take 5,000 Units by mouth daily.    . cyclobenzaprine (FLEXERIL) 10 MG tablet Take 10 mg by mouth at bedtime.    . cyclobenzaprine (FLEXERIL) 5 MG tablet Take 1 tablet (5 mg total) by mouth 3 (three) times daily as needed for muscle spasms. (Patient not taking: Reported on 02/19/2019) 30 tablet 0  . estradiol (VIVELLE-DOT) 0.025 MG/24HR Place 1 patch onto the skin 2  (two) times a week.    . levETIRAcetam (KEPPRA) 500 MG tablet Take 1 tablet (500 mg total) by mouth 2 (two) times daily. 60 tablet 0  . lidocaine (LIDODERM) 5 % Place 1 patch onto the skin every 12 (twelve) hours.    . Magnesium Oxide (MAG-OXIDE) 200 MG TABS Take 1 tablet (200 mg total) by mouth daily. 5 tablet 0  . meclizine (ANTIVERT) 25 MG tablet Take 1 tablet (25 mg total) by mouth 3 (three) times daily as needed for dizziness. 15 tablet 0  . ondansetron (ZOFRAN ODT) 4 MG disintegrating tablet Take 1 tablet (4 mg total) by mouth every 8 (eight) hours as needed for nausea or vomiting. (Patient not taking: Reported on 02/19/2019) 20 tablet 0  . potassium chloride SA (KLOR-CON) 20 MEQ tablet Take 1 tablet (20 mEq total) by mouth 2 (two) times daily for 5 days. 10 tablet 0  . ULTRAM 50 MG tablet Take 50 mg by mouth every 8 (eight) hours.     Social History   Socioeconomic History  . Marital status: Married    Spouse name: Not on file  . Number of children: Not on file  . Years of education: Not on file  . Highest education level: Not on file  Occupational History  . Not on file  Tobacco Use  . Smoking status: Never Smoker  . Smokeless tobacco: Never Used  Substance and Sexual Activity  . Alcohol use: No  . Drug use: No  .  Sexual activity: Yes    Birth control/protection: Surgical    Comment: HYST   Other Topics Concern  . Not on file  Social History Narrative  . Not on file   Social Determinants of Health   Financial Resource Strain:   . Difficulty of Paying Living Expenses: Not on file  Food Insecurity:   . Worried About Charity fundraiser in the Last Year: Not on file  . Ran Out of Food in the Last Year: Not on file  Transportation Needs:   . Lack of Transportation (Medical): Not on file  . Lack of Transportation (Non-Medical): Not on file  Physical Activity:   . Days of Exercise per Week: Not on file  . Minutes of Exercise per Session: Not on file  Stress:   . Feeling  of Stress : Not on file  Social Connections:   . Frequency of Communication with Friends and Family: Not on file  . Frequency of Social Gatherings with Friends and Family: Not on file  . Attends Religious Services: Not on file  . Active Member of Clubs or Organizations: Not on file  . Attends Archivist Meetings: Not on file  . Marital Status: Not on file  Intimate Partner Violence:   . Fear of Current or Ex-Partner: Not on file  . Emotionally Abused: Not on file  . Physically Abused: Not on file  . Sexually Abused: Not on file   Family History  Problem Relation Age of Onset  . Stroke Mother   . Hypertension Mother   . Hypertension Father   . Ovarian cancer Maternal Grandmother   . Stroke Maternal Grandfather   . Throat cancer Paternal Grandmother   . Heart disease Paternal Grandfather   . Heart disease Brother        Passed away at 15    OBJECTIVE:  Vitals:   11/19/19 0855  BP: 129/86  Pulse: 77  Resp: 16  Temp: 98.5 F (36.9 C)  SpO2: 98%     General appearance: alert; appears fatigued, but nontoxic; speaking in full sentences and tolerating own secretions HEENT: NCAT; Ears: EACs clear, TMs pearly gray; Eyes: PERRL.  EOM grossly intact. Sinuses: nontender; Nose: nares patent without rhinorrhea, Throat: oropharynx clear, tonsils non erythematous or enlarged, uvula midline  Neck: supple without LAD Lungs: unlabored respirations, symmetrical air entry; cough: moderate; no respiratory distress; CTAB Heart: regular rate and rhythm.  Radial pulses 2+ symmetrical bilaterally Skin: warm and dry Psychological: alert and cooperative; normal mood and affect  LABS:  No results found for this or any previous visit (from the past 24 hour(s)).   ASSESSMENT & PLAN:  1. URI with cough and congestion   2. Sore throat   3. Encounter for screening for COVID-19     Meds ordered this encounter  Medications  . fluticasone (FLONASE) 50 MCG/ACT nasal spray    Sig:  Place 1 spray into both nostrils daily for 14 days.    Dispense:  16 g    Refill:  0  . cetirizine (ZYRTEC ALLERGY) 10 MG tablet    Sig: Take 1 tablet (10 mg total) by mouth daily.    Dispense:  30 tablet    Refill:  0  . benzonatate (TESSALON) 100 MG capsule    Sig: Take 1 capsule (100 mg total) by mouth every 8 (eight) hours.    Dispense:  30 capsule    Refill:  0  . dexamethasone (DECADRON) 4 MG tablet  Sig: Take 1 tablet (4 mg total) by mouth daily for 7 days.    Dispense:  7 tablet    Refill:  0    Discharge Instructions  COVID testing ordered.  It will take between 2-7 days for test results.  Someone will contact you regarding abnormal results.    In the meantime: You should remain isolated in your home for 10 days from symptom onset AND greater than 24 hours after symptoms resolution (absence of fever without the use of fever-reducing medication and improvement in respiratory symptoms), whichever is longer Get plenty of rest and push fluids Tessalon Perles prescribed for cough Zzyrtec for nasal congestion, runny nose, and/or sore throat Flonase for nasal congestion and runny nose Decadron was prescribed Use medications daily for symptom relief Use OTC medications like ibuprofen or tylenol as needed fever or pain Call or go to the ED if you have any new or worsening symptoms such as fever, worsening cough, shortness of breath, chest tightness, chest pain, turning blue, changes in mental status, etc...   Reviewed expectations re: course of current medical issues. Questions answered. Outlined signs and symptoms indicating need for more acute intervention. Patient verbalized understanding. After Visit Summary given.         Emerson Monte, [place] 11/19/19 [phone]

## 2019-11-19 NOTE — ED Triage Notes (Signed)
Pt presents with cough and sore throat for past 3 days , no fever

## 2019-11-20 LAB — COVID-19, FLU A+B AND RSV
Influenza A, NAA: NOT DETECTED
Influenza B, NAA: NOT DETECTED
RSV, NAA: NOT DETECTED
SARS-CoV-2, NAA: NOT DETECTED

## 2019-11-24 ENCOUNTER — Ambulatory Visit
Admission: RE | Admit: 2019-11-24 | Discharge: 2019-11-24 | Disposition: A | Discharge status: Home / Self Care | Payer: BC Managed Care – PPO | Source: Ambulatory Visit | Attending: Emergency Medicine | Admitting: Emergency Medicine

## 2019-11-24 ENCOUNTER — Other Ambulatory Visit: Payer: Self-pay

## 2019-11-24 ENCOUNTER — Ambulatory Visit (INDEPENDENT_AMBULATORY_CARE_PROVIDER_SITE_OTHER): Payer: BC Managed Care – PPO

## 2019-11-24 VITALS — BP 159/92 | HR 81 | Temp 98.0°F | Resp 20

## 2019-11-24 DIAGNOSIS — R059 Cough, unspecified: Secondary | ICD-10-CM | POA: Diagnosis not present

## 2019-11-24 DIAGNOSIS — R062 Wheezing: Secondary | ICD-10-CM

## 2019-11-24 DIAGNOSIS — J069 Acute upper respiratory infection, unspecified: Secondary | ICD-10-CM | POA: Diagnosis not present

## 2019-11-24 MED ORDER — ALBUTEROL SULFATE HFA 108 (90 BASE) MCG/ACT IN AERS: 1.0000 | INHALATION_SPRAY | Freq: Four times a day (QID) | RESPIRATORY_TRACT | 1 refills | Status: AC | PRN

## 2019-11-24 MED ORDER — PREDNISONE 10 MG (21) PO TBPK: ORAL_TABLET | Freq: Every day | ORAL | 0 refills | Status: DC

## 2019-11-24 NOTE — ED Triage Notes (Signed)
Pt presents with worsening cough since visit on Friday

## 2019-11-24 NOTE — ED Provider Notes (Signed)
[place]   [id number] 11/24/19 Arrival Time: 2703   CC: URI  SUBJECTIVE: History from: patient.  Madison Armstrong is a 54 y.o. female who presented to the urgent care with a complaint of worsening cough for the past 5 days.  Denies sick exposure to COVID, flu or strep.  Has tested negative for Covid.  Denies recent travel.  Has tried OTC medication without relief.  Denies aggravating factors.  Denies previous symptoms in the past.   Denies fever, chills, fatigue, sinus pain, rhinorrhea, sore throat, SOB, wheezing, chest pain, nausea, changes in bowel or bladder habits.     ROS: As per HPI.  All other pertinent ROS negative.      Past Medical History:  Diagnosis Date  . Chicken pox   . Diabetes mellitus without complication [facility])    Past Surgical History:  Procedure Laterality Date  . ABDOMINAL HYSTERECTOMY    . COLONOSCOPY WITH PROPOFOL N/A 08/31/2014   Procedure: COLONOSCOPY WITH PROPOFOL;  Surgeon: Manya Silvas, MD;  Location: [facility] ENDOSCOPY;  Service: Endoscopy;  Laterality: N/A;  . ESOPHAGOGASTRODUODENOSCOPY (EGD) WITH PROPOFOL N/A 08/31/2014   Procedure: ESOPHAGOGASTRODUODENOSCOPY (EGD) WITH PROPOFOL;  Surgeon: Manya Silvas, MD;  Location: [facility] ENDOSCOPY;  Service: Endoscopy;  Laterality: N/A;  . nipple removal     Allergies  Allergen Reactions  . Tramadol    No current facility-administered medications on file prior to encounter.   Current Outpatient Medications on File Prior to Encounter  Medication Sig Dispense Refill  . benzonatate (TESSALON) 100 MG capsule Take 1 capsule (100 mg total) by mouth every 8 (eight) hours. 30 capsule 0  . CELEBREX 200 MG capsule Take 200 mg by mouth 2 (two) times daily.    . cetirizine (ZYRTEC ALLERGY) 10 MG tablet Take 1 tablet (10 mg total) by mouth daily. 30 tablet 0  . Cholecalciferol 125 MCG (5000 UT) TABS Take 5,000 Units by mouth daily.    . cyclobenzaprine (FLEXERIL) 10 MG tablet Take 10 mg by mouth at  bedtime.    . cyclobenzaprine (FLEXERIL) 5 MG tablet Take 1 tablet (5 mg total) by mouth 3 (three) times daily as needed for muscle spasms. (Patient not taking: Reported on 02/19/2019) 30 tablet 0  . dexamethasone (DECADRON) 4 MG tablet Take 1 tablet (4 mg total) by mouth daily for 7 days. 7 tablet 0  . estradiol (VIVELLE-DOT) 0.025 MG/24HR Place 1 patch onto the skin 2 (two) times a week.    . fluticasone (FLONASE) 50 MCG/ACT nasal spray Place 1 spray into both nostrils daily for 14 days. 16 g 0  . levETIRAcetam (KEPPRA) 500 MG tablet Take 1 tablet (500 mg total) by mouth 2 (two) times daily. 60 tablet 0  . lidocaine (LIDODERM) 5 % Place 1 patch onto the skin every 12 (twelve) hours.    . Magnesium Oxide (MAG-OXIDE) 200 MG TABS Take 1 tablet (200 mg total) by mouth daily. 5 tablet 0  . meclizine (ANTIVERT) 25 MG tablet Take 1 tablet (25 mg total) by mouth 3 (three) times daily as needed for dizziness. 15 tablet 0  . ondansetron (ZOFRAN ODT) 4 MG disintegrating tablet Take 1 tablet (4 mg total) by mouth every 8 (eight) hours as needed for nausea or vomiting. (Patient not taking: Reported on 02/19/2019) 20 tablet 0  . potassium chloride SA (KLOR-CON) 20 MEQ tablet Take 1 tablet (20 mEq total) by mouth 2 (two) times daily for 5 days. 10 tablet 0  . ULTRAM 50 MG  tablet Take 50 mg by mouth every 8 (eight) hours.     Social History   Socioeconomic History  . Marital status: Married    Spouse name: Not on file  . Number of children: Not on file  . Years of education: Not on file  . Highest education level: Not on file  Occupational History  . Not on file  Tobacco Use  . Smoking status: Never Smoker  . Smokeless tobacco: Never Used  Substance and Sexual Activity  . Alcohol use: No  . Drug use: No  . Sexual activity: Yes    Birth control/protection: Surgical    Comment: HYST   Other Topics Concern  . Not on file  Social History Narrative  . Not on file   Social Determinants of Health    Financial Resource Strain:   . Difficulty of Paying Living Expenses: Not on file  Food Insecurity:   . Worried About Charity fundraiser in the Last Year: Not on file  . Ran Out of Food in the Last Year: Not on file  Transportation Needs:   . Lack of Transportation (Medical): Not on file  . Lack of Transportation (Non-Medical): Not on file  Physical Activity:   . Days of Exercise per Week: Not on file  . Minutes of Exercise per Session: Not on file  Stress:   . Feeling of Stress : Not on file  Social Connections:   . Frequency of Communication with Friends and Family: Not on file  . Frequency of Social Gatherings with Friends and Family: Not on file  . Attends Religious Services: Not on file  . Active Member of Clubs or Organizations: Not on file  . Attends Archivist Meetings: Not on file  . Marital Status: Not on file  Intimate Partner Violence:   . Fear of Current or Ex-Partner: Not on file  . Emotionally Abused: Not on file  . Physically Abused: Not on file  . Sexually Abused: Not on file   Family History  Problem Relation Age of Onset  . Stroke Mother   . Hypertension Mother   . Hypertension Father   . Ovarian cancer Maternal Grandmother   . Stroke Maternal Grandfather   . Throat cancer Paternal Grandmother   . Heart disease Paternal Grandfather   . Heart disease Brother        Passed away at 62    OBJECTIVE:  Vitals:   11/24/19 0900  BP: (!) 159/92  Pulse: 81  Resp: 20  Temp: 98 F (36.7 C)  SpO2: 97%     General appearance: alert; appears fatigued, but nontoxic; speaking in full sentences and tolerating own secretions HEENT: NCAT; Ears: EACs clear, TMs pearly gray; Eyes: PERRL.  EOM grossly intact. Sinuses: nontender; Nose: nares patent without rhinorrhea, Throat: oropharynx clear, tonsils non erythematous or enlarged, uvula midline  Neck: supple without LAD Lungs: unlabored respirations, symmetrical air entry; cough: moderate; wheezing  present; CTAB Heart: regular rate and rhythm.  Radial pulses 2+ symmetrical bilaterally Skin: warm and dry Psychological: alert and cooperative; normal mood and affect  LABS:  No results found for this or any previous visit (from the past 24 hour(s)).   RADIOLOGY:  DG Chest 2 View  Result Date: 11/24/2019 CLINICAL DATA:  54 year old female with worsening cough, on steroids. EXAM: CHEST - 2 VIEW COMPARISON:  05/10/2013 FINDINGS: The heart size and mediastinal contours are within normal limits. Both lungs are clear. The visualized skeletal structures are unremarkable. IMPRESSION:  No active cardiopulmonary disease. Electronically Signed   By: Ruthann Cancer MD   On: 11/24/2019 09:12   Chest x-ray is negative for acute pulmonary disease I have reviewed the x-ray myself and the radiologist interpretation.  I am in agreement with the radiologist interpretation.   ASSESSMENT & PLAN:  1. Wheezing   2. URI with cough and congestion     Meds ordered this encounter  Medications  . albuterol (VENTOLIN HFA) 108 (90 Base) MCG/ACT inhaler    Sig: Inhale 1-2 puffs into the lungs every 6 (six) hours as needed for wheezing or shortness of breath.    Dispense:  18 g    Refill:  1  . predniSONE (STERAPRED UNI-PAK 21 TAB) 10 MG (21) TBPK tablet    Sig: Take by mouth daily. Take 6 tabs by mouth daily  for 1 days, then 5 tabs for 1 days, then 4 tabs for 1 days, then 3 tabs for 1 days, 2 tabs for 1 days, then 1 tab by mouth daily for 1 days    Dispense:  21 tablet    Refill:  0    Discharge instructions   Get plenty of rest and push fluids Tessalon Perles prescribed for cough/maximum tablet in a day is 600 mg Prednisone taper was prescribed take as directed  ProAir was prescribed  Continue to use all other medication as directed  use medications daily for symptom relief May use OTC Mucinex Use OTC medications like ibuprofen or tylenol as needed fever or pain Call or go to the ED if you have any  new or worsening symptoms such as fever, worsening cough, shortness of breath, chest tightness, chest pain, turning blue, changes in mental status, etc...   Reviewed expectations re: course of current medical issues. Questions answered. Outlined signs and symptoms indicating need for more acute intervention. Patient verbalized understanding. After Visit Summary given.         Emerson Monte, [place] 11/24/19 [phone]

## 2019-11-24 NOTE — Discharge Instructions (Addendum)
Get plenty of rest and push fluids Tessalon Perles prescribed for cough/maximum tablet in a day is 600 mg Prednisone taper was prescribed take as directed  ProAir was prescribed  Continue to use all other medication as directed  use medications daily for symptom relief May use OTC Mucinex Use OTC medications like ibuprofen or tylenol as needed fever or pain Call or go to the ED if you have any new or worsening symptoms such as fever, worsening cough, shortness of breath, chest tightness, chest pain, turning blue, changes in mental status, etc..Marland Kitchen

## 2020-03-21 DIAGNOSIS — J0100 Acute maxillary sinusitis, unspecified: Secondary | ICD-10-CM | POA: Diagnosis not present

## 2020-06-16 DIAGNOSIS — M1711 Unilateral primary osteoarthritis, right knee: Secondary | ICD-10-CM | POA: Diagnosis not present

## 2020-06-16 DIAGNOSIS — S83232A Complex tear of medial meniscus, current injury, left knee, initial encounter: Secondary | ICD-10-CM | POA: Diagnosis not present

## 2020-06-19 DIAGNOSIS — R42 Dizziness and giddiness: Secondary | ICD-10-CM | POA: Diagnosis not present

## 2020-06-19 DIAGNOSIS — H6983 Other specified disorders of Eustachian tube, bilateral: Secondary | ICD-10-CM | POA: Diagnosis not present

## 2020-06-26 IMAGING — MR MR HEAD W/O CM
12 of 13 series · 44 of 48 positions shown · non-contrast
Comparison: CT and CTA from earlier today

CLINICAL DATA: Ataxia with stroke suspected

EXAM:
MRI HEAD WITHOUT CONTRAST
TECHNIQUE: Multiplanar, multiecho pulse sequences of the brain and surrounding
structures were obtained without intravenous contrast.

[Series 18: DWI · axial · 3.0mm · 0.96mm/px · z∈[-140,+18]mm · 8 of 108 slices shown (1 of 4)]
[im 1/108]
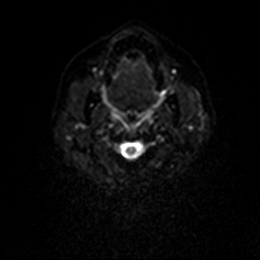
[im 16/108]
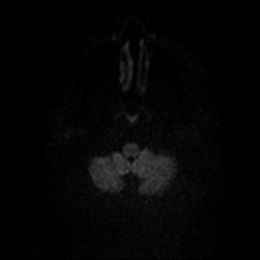
[im 31/108]
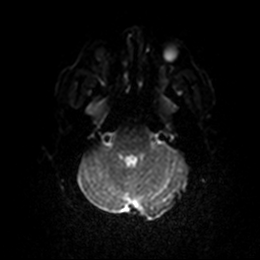
[im 46/108]
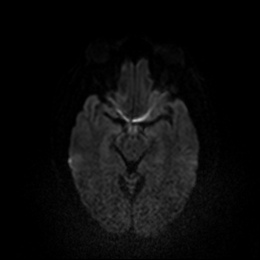
[im 62/108]
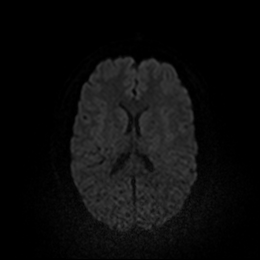
[im 77/108]
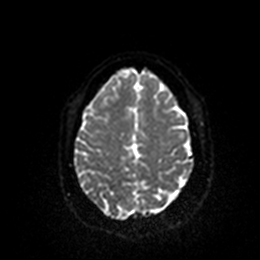
[im 92/108]
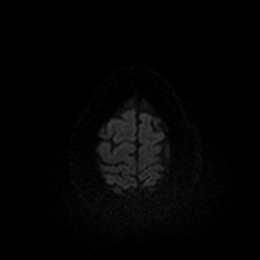
[im 108/108]
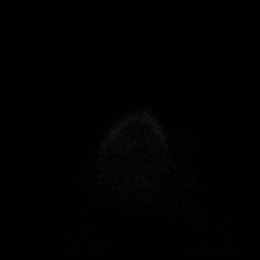

[Series 19: DWI · axial · 3.0mm · 0.96mm/px · z∈[-140,+18]mm · 4 of 54 slices shown (2 of 4)]
[im 1/54]
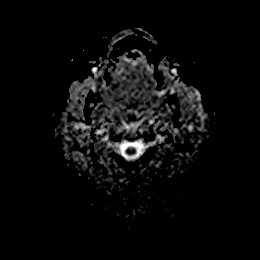
[im 18/54]
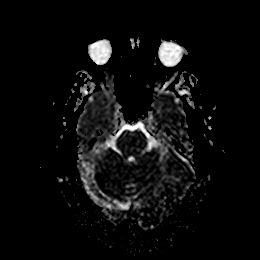
[im 36/54]
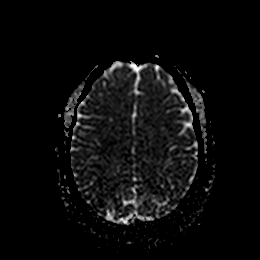
[im 54/54]
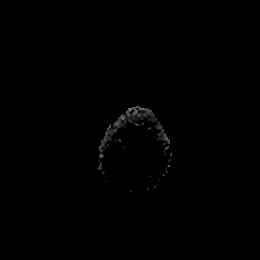

[Series 20: DWI · coronal · 4.0mm · 0.88mm/px · 5 of 74 slices shown (3 of 4)]
[im 1/74]
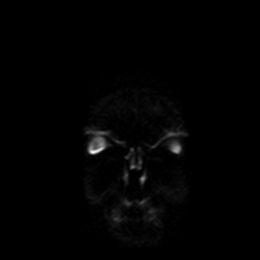
[im 19/74]
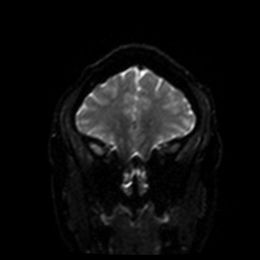
[im 37/74]
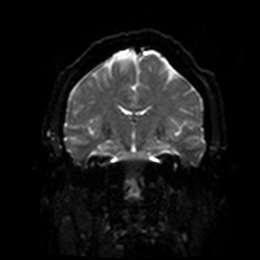
[im 55/74]
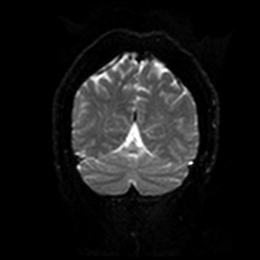
[im 74/74]
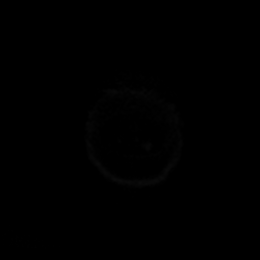

[Series 21: DWI · coronal · 4.0mm · 0.88mm/px · 3 of 37 slices shown (4 of 4)]
[im 1/37]
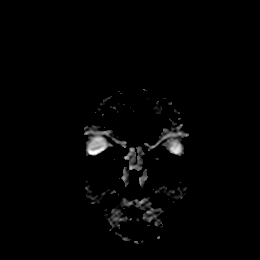
[im 19/37]
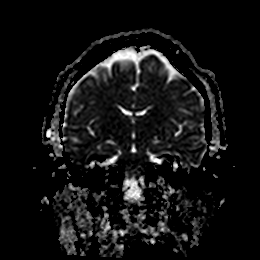
[im 37/37]
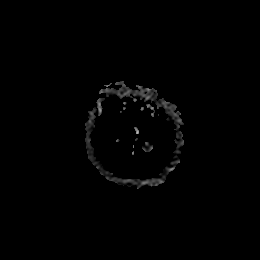

[Series 22: FLAIR · axial · 5.0mm · 0.90mm/px · z∈[-134,+15]mm · 2 of 26 slices shown]
[im 1/26]
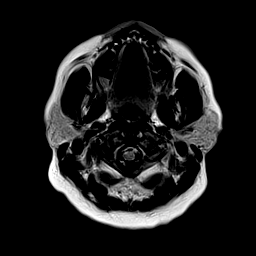
[im 26/26]
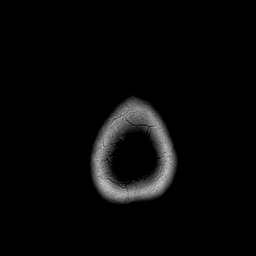

[Series 23: T2 · axial · 5.0mm · 0.72mm/px · z∈[-131,+12]mm · 2 of 25 slices shown (1 of 2)]
[im 1/25]
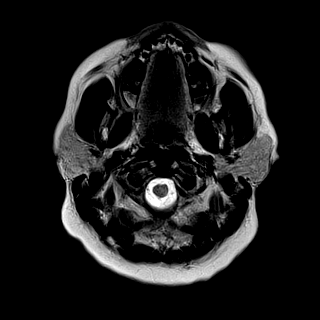
[im 25/25]
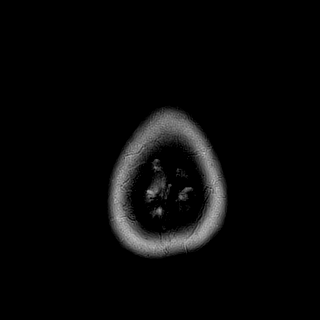

[Series 24: mag_images · axial · 3.0mm · 0.90mm/px · z∈[-148,+28]mm · 4 of 60 slices shown]
[im 1/60]
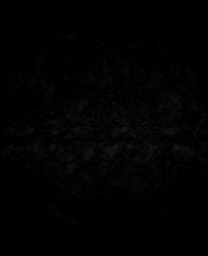
[im 20/60]
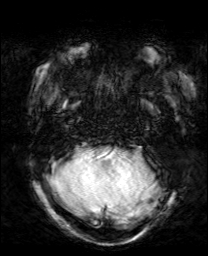
[im 40/60]
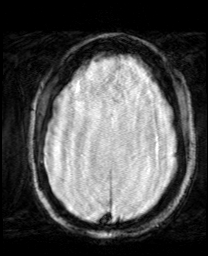
[im 60/60]
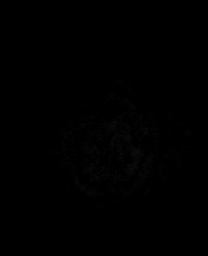

[Series 25: pha_images · axial · 3.0mm · 0.90mm/px · z∈[-139,+28]mm · 4 of 56 slices shown]
[im 1/56]
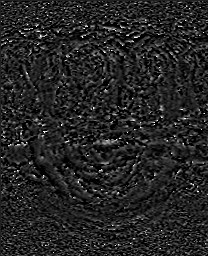
[im 19/56]
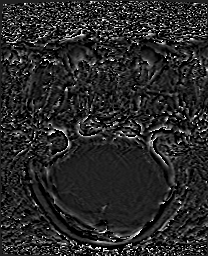
[im 37/56]
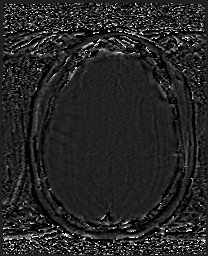
[im 56/56]
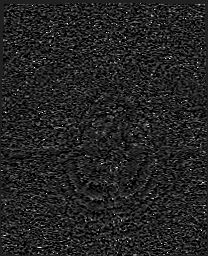

[Series 26: swi_images · axial · 3.0mm · 0.90mm/px · z∈[-148,+28]mm · 4 of 60 slices shown]
[im 1/60]
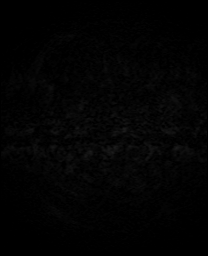
[im 20/60]
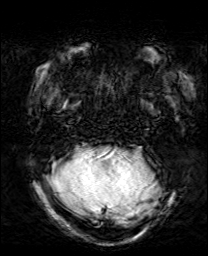
[im 40/60]
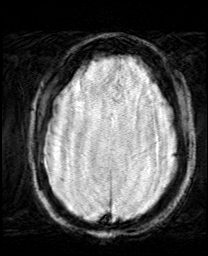
[im 60/60]
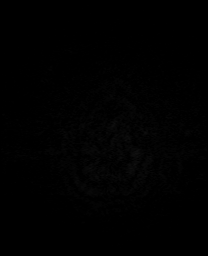

[Series 27: mip_images(sw) · axial · 24.0mm · 0.90mm/px · z∈[-137,+17]mm · 4 of 53 slices shown]
[im 1/53]
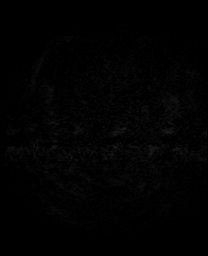
[im 18/53]
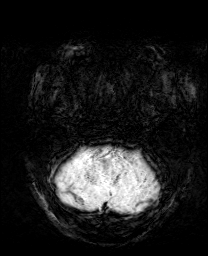
[im 35/53]
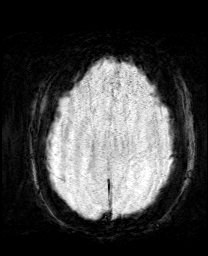
[im 53/53]
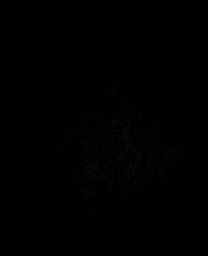

[Series 28: T1 · sagittal · 5.0mm · 0.78mm/px · 2 of 25 slices shown]
[im 1/25]
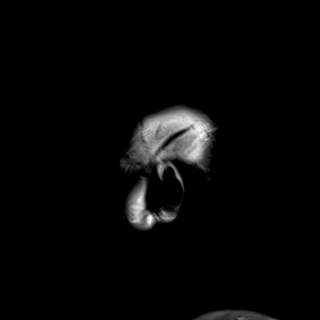
[im 25/25]
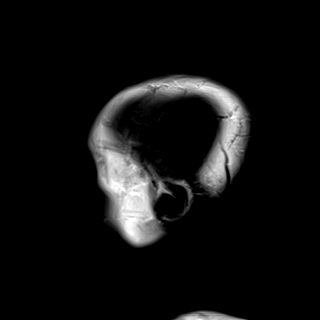

[Series 30: T2 · coronal · 5.0mm · 0.72mm/px · 2 of 31 slices shown (2 of 2)]
[im 1/31]
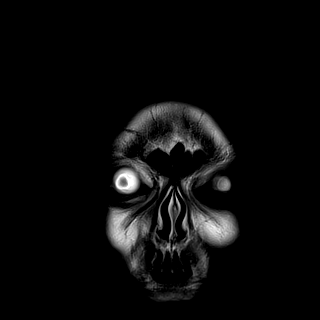
[im 31/31]
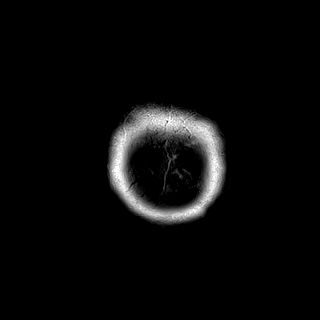

[44 of 48 positions shown; findings below may reference images not displayed]

FINDINGS: Brain: No acute infarction, hemorrhage, hydrocephalus, extra-axial
collection or mass lesion. One or 2 remote FLAIR hyperintensities in
the cerebral white matter, incidental for age.

Vascular: Normal flow voids.

Skull and upper cervical spine: Normal marrow signal.

Sinuses/Orbits: Negative.

Other: Intermittently motion degraded, for example sagittal T1
weighted imaging is nondiagnostic.
IMPRESSION: 1. Negative brain MRI.  No acute infarct.
2. Intermittent motion artifact.

## 2020-07-05 DIAGNOSIS — M25561 Pain in right knee: Secondary | ICD-10-CM | POA: Diagnosis not present

## 2020-07-05 DIAGNOSIS — M25562 Pain in left knee: Secondary | ICD-10-CM | POA: Diagnosis not present

## 2020-07-05 DIAGNOSIS — M545 Low back pain, unspecified: Secondary | ICD-10-CM | POA: Diagnosis not present

## 2020-07-05 DIAGNOSIS — G4733 Obstructive sleep apnea (adult) (pediatric): Secondary | ICD-10-CM | POA: Diagnosis not present

## 2020-07-06 DIAGNOSIS — M25561 Pain in right knee: Secondary | ICD-10-CM | POA: Insufficient documentation

## 2020-07-06 DIAGNOSIS — M25562 Pain in left knee: Secondary | ICD-10-CM | POA: Insufficient documentation

## 2020-07-06 DIAGNOSIS — G8929 Other chronic pain: Secondary | ICD-10-CM | POA: Insufficient documentation

## 2020-07-07 DIAGNOSIS — M25562 Pain in left knee: Secondary | ICD-10-CM | POA: Diagnosis not present

## 2020-07-07 DIAGNOSIS — S83232A Complex tear of medial meniscus, current injury, left knee, initial encounter: Secondary | ICD-10-CM | POA: Diagnosis not present

## 2020-07-10 DIAGNOSIS — M1710 Unilateral primary osteoarthritis, unspecified knee: Secondary | ICD-10-CM | POA: Insufficient documentation

## 2020-07-12 ENCOUNTER — Ambulatory Visit
Admission: RE | Admit: 2020-07-12 | Discharge: 2020-07-12 | Disposition: A | Discharge status: Home / Self Care | Payer: BC Managed Care – PPO | Source: Ambulatory Visit | Attending: Family Medicine | Admitting: Family Medicine

## 2020-07-12 ENCOUNTER — Other Ambulatory Visit: Payer: Self-pay

## 2020-07-12 DIAGNOSIS — Z1152 Encounter for screening for COVID-19: Secondary | ICD-10-CM | POA: Diagnosis not present

## 2020-07-12 NOTE — ED Triage Notes (Signed)
Needs covid test for travel

## 2020-07-13 LAB — SARS-COV-2, NAA 2 DAY TAT

## 2020-07-13 LAB — NOVEL CORONAVIRUS, NAA: SARS-CoV-2, NAA: NOT DETECTED

## 2020-07-26 DIAGNOSIS — R051 Acute cough: Secondary | ICD-10-CM | POA: Diagnosis not present

## 2020-07-26 DIAGNOSIS — U071 COVID-19: Secondary | ICD-10-CM | POA: Diagnosis not present

## 2020-08-18 ENCOUNTER — Other Ambulatory Visit: Payer: Self-pay

## 2020-08-18 ENCOUNTER — Ambulatory Visit
Admission: RE | Admit: 2020-08-18 | Discharge: 2020-08-18 | Disposition: A | Discharge status: Home / Self Care | Payer: BC Managed Care – PPO | Source: Ambulatory Visit | Attending: Emergency Medicine | Admitting: Emergency Medicine

## 2020-08-18 VITALS — BP 130/88 | HR 75 | Temp 97.9°F | Resp 17

## 2020-08-18 DIAGNOSIS — M7750 Other enthesopathy of unspecified foot: Secondary | ICD-10-CM | POA: Diagnosis not present

## 2020-08-18 NOTE — ED Triage Notes (Signed)
Patient c/o RT foot pain x 1 month.   Patient denies fall or Trauma.   Patient endorses worsening symptoms.   Patient endorses having a previous episode of foot pain that occurred and was diagnosed with a "hairline fracture".   Patient endorses history of right knee pain and states " I've begin having issues with walking due to favoring my LFT side and been having issues with walking.".   Patient endorses increased pain with ambulation.   Patient has taken Ibuprofen, Prednisone, and Tylenol with no relief of symptoms.

## 2020-08-18 NOTE — ED Provider Notes (Signed)
Chief Complaint   Chief Complaint  Patient presents with   Foot Pain   APPT 0900     Subjective, HPI  Madison Armstrong is a 55 y.o. female who presents with right foot pain for about a month.  Patient reports a history of previous "hairline fracture". She reports increased pain with ambulation.  Patient reports that her pain seems to worsen by the end of the day.  She reports that her pain is to the plantar aspect of the right foot.  Patient has used ibuprofen, prednisone and Tylenol without relief.  She does not report any recent injury to the area, trauma or falls.  History obtained from patient.  Patient's problem list, past medical and social history, medications, and allergies were reviewed by me and updated in Epic.    ROS  See HPI.  Objective   Vitals:   08/18/20 0915  BP: 130/88  Pulse: 75  Resp: 17  Temp: 97.9 F (36.6 C)  SpO2: 97%    Vital signs and nursing note reviewed.   General: Appears well-developed and well-nourished. No acute distress.  Head: Normocephalic and atraumatic.   Neck: Normal range of motion, neck is supple.  Cardiovascular: Normal rate. Pulm/Chest: No respiratory distress.  Musculoskeletal: Right foot: TTP noted to plantar aspect of right foot at ball of foot with acute pain when flexing toes.  5/5 strength, full sensation, 2+  pulses, < 2 sec cap refill.  Neurological: Alert and oriented to person, place, and time.  Skin: Skin is warm and dry.   Psychiatric: Normal mood, affect, behavior, and thought content.    Data  No results found for any visits on 08/18/20.   Imaging None needed.  No trauma or falls precipitating current symptoms.  Assessment & Plan  1. Tendonitis of foot  55 y.o. female presents with right foot pain for about a month.  Patient reports a history of previous "hairline fracture". She reports increased pain with ambulation.  Patient reports that her pain seems to worsen by the end of the day.  She reports that her  pain is to the plantar aspect of the right foot.  Patient has used ibuprofen, prednisone and Tylenol without relief.  She does not report any recent injury to the area, trauma or falls.  Chart review completed.  Given symptoms along with assessment findings, likely right foot tendinitis.  Advised about home treatment and care along with strict return precautions for worsening of symptoms.  Rest the area, ice the area and use over-the-counter Tylenol versus ibuprofen as needed.  Did advise that she will need to follow-up with her PCP if her symptoms do not improve over the next 2 to 3 weeks as she may be referred to physical therapy or orthopedics for further evaluation.  No concern at this time that the patient will require x-ray imaging to the right foot.  The patient did ask to be placed in a boot in clinic today for which was ordered.  Return as needed.  Stable on discharge.  Patient verbalized understanding and agreed with plan.  Plan:   Discharge Instructions      Follow up with PCP to inform of visit and treatment in clinic today as orthopedic or PT referral may be needed if symptoms persist longer than 2-3 weeks.  Increase fluid intake. Roll your foot over a frozen bottled water 3-5 times daily Apply ice to affected area 3-5 times daily for 15-20 minute intervals. Take all medications as prescribed. May  take 1000 mg Tylenol  OR 600 mg ibuprofen every 8 hours as needed for pain as long as neither medication is contraindicated to your current health conditions. Return to clinic if symptoms worsen. If you experience shortness of breath, chest pain, dizziness, fainting or severe headache go to the ER.          Serafina Royals, [place] 08/18/20 1027

## 2020-08-18 NOTE — Discharge Instructions (Addendum)
Follow up with PCP to inform of visit and treatment in clinic today as orthopedic or PT referral may be needed if symptoms persist longer than 2-3 weeks.  Increase fluid intake. Roll your foot over a frozen bottled water 3-5 times daily Apply ice to affected area 3-5 times daily for 15-20 minute intervals. Take all medications as prescribed. May take 1000 mg Tylenol  OR 600 mg ibuprofen every 8 hours as needed for pain as long as neither medication is contraindicated to your current health conditions. Return to clinic if symptoms worsen. If you experience shortness of breath, chest pain, dizziness, fainting or severe headache go to the ER.

## 2020-09-07 ENCOUNTER — Ambulatory Visit: Payer: BC Managed Care – PPO | Admitting: Podiatry

## 2020-09-07 ENCOUNTER — Ambulatory Visit (INDEPENDENT_AMBULATORY_CARE_PROVIDER_SITE_OTHER): Payer: BC Managed Care – PPO

## 2020-09-07 ENCOUNTER — Other Ambulatory Visit: Payer: Self-pay

## 2020-09-07 DIAGNOSIS — B353 Tinea pedis: Secondary | ICD-10-CM | POA: Diagnosis not present

## 2020-09-07 DIAGNOSIS — Q666 Other congenital valgus deformities of feet: Secondary | ICD-10-CM

## 2020-09-07 DIAGNOSIS — M79671 Pain in right foot: Secondary | ICD-10-CM

## 2020-09-07 DIAGNOSIS — Z79899 Other long term (current) drug therapy: Secondary | ICD-10-CM

## 2020-09-07 MED ORDER — CLOTRIMAZOLE-BETAMETHASONE 1-0.05 % EX CREA: 1.0000 "application " | TOPICAL_CREAM | Freq: Every day | CUTANEOUS | 2 refills | Status: DC

## 2020-09-08 LAB — HEPATIC FUNCTION PANEL
ALT: 22 IU/L (ref 0–32)
AST: 16 IU/L (ref 0–40)
Albumin: 3.9 g/dL (ref 3.8–4.9)
Alkaline Phosphatase: 64 IU/L (ref 44–121)
Bilirubin Total: 0.3 mg/dL (ref 0.0–1.2)
Bilirubin, Direct: <0.1 mg/dL (ref 0.00–0.40)
Total Protein: 7.3 g/dL (ref 6.0–8.5)

## 2020-09-11 ENCOUNTER — Other Ambulatory Visit: Payer: Self-pay | Admitting: Podiatry

## 2020-09-11 MED ORDER — TERBINAFINE HCL 250 MG PO TABS: 250.0000 mg | ORAL_TABLET | Freq: Every day | ORAL | 0 refills | Status: DC

## 2020-09-12 NOTE — Progress Notes (Signed)
Subjective:  Patient ID: Madison Armstrong, female    DOB: 08/27/1965,  MRN: [id number]  Chief Complaint  Patient presents with   Foot Pain    Bilateral foot pain PT stated that she has a throbbing and burning sensation Nail fungus    56 y.o. female presents with the above complaint.  Patient presents with primary complaint of toenails x10 with nail fungus.  Patient would like to discuss treatment options for her.  It been present for years and has progressed to gotten worse.  She has secondary complaint of flatfoot.  She would like to discuss orthotics options for that.  She states that it hurts especially the arch and the heel when she is ambulating.  She has tertiary complaint of bilateral athlete's foot/nail fungus on the plantar foot.  She has tried some over-the-counter medication none of which has helped.  She would like to discuss treatment options for that.  She has not seen anyone else prior to seeing me.  She has not tried any prescription medication.   Review of Systems: Negative except as noted in the HPI. Denies N/V/F/Ch.  Past Medical History:  Diagnosis Date   Chicken pox    Diabetes mellitus without complication ([place])     Current Outpatient Medications:    clotrimazole-betamethasone (LOTRISONE) cream, Apply 1 application topically daily., Disp: 30 g, Rfl: 2   albuterol (VENTOLIN HFA) 108 (90 Base) MCG/ACT inhaler, Inhale 1-2 puffs into the lungs every 6 (six) hours as needed for wheezing or shortness of breath., Disp: 18 g, Rfl: 1   benzonatate (TESSALON) 100 MG capsule, Take 1 capsule (100 mg total) by mouth every 8 (eight) hours., Disp: 30 capsule, Rfl: 0   CELEBREX 200 MG capsule, Take 200 mg by mouth 2 (two) times daily., Disp: , Rfl:    cetirizine (ZYRTEC ALLERGY) 10 MG tablet, Take 1 tablet (10 mg total) by mouth daily., Disp: 30 tablet, Rfl: 0   Cholecalciferol 125 MCG (5000 UT) TABS, Take 5,000 Units by mouth daily., Disp: , Rfl:    cyclobenzaprine (FLEXERIL) 10  MG tablet, Take 10 mg by mouth at bedtime., Disp: , Rfl:    cyclobenzaprine (FLEXERIL) 5 MG tablet, Take 1 tablet (5 mg total) by mouth 3 (three) times daily as needed for muscle spasms. (Patient not taking: No sig reported), Disp: 30 tablet, Rfl: 0   estradiol (VIVELLE-DOT) 0.025 MG/24HR, Place 1 patch onto the skin 2 (two) times a week., Disp: , Rfl:    fluticasone (FLONASE) 50 MCG/ACT nasal spray, Place 1 spray into both nostrils daily for 14 days., Disp: 16 g, Rfl: 0   levETIRAcetam (KEPPRA) 500 MG tablet, Take 1 tablet (500 mg total) by mouth 2 (two) times daily., Disp: 60 tablet, Rfl: 0   lidocaine (LIDODERM) 5 %, Place 1 patch onto the skin every 12 (twelve) hours., Disp: , Rfl:    Magnesium Oxide (MAG-OXIDE) 200 MG TABS, Take 1 tablet (200 mg total) by mouth daily., Disp: 5 tablet, Rfl: 0   meclizine (ANTIVERT) 25 MG tablet, Take 1 tablet (25 mg total) by mouth 3 (three) times daily as needed for dizziness., Disp: 15 tablet, Rfl: 0   ondansetron (ZOFRAN ODT) 4 MG disintegrating tablet, Take 1 tablet (4 mg total) by mouth every 8 (eight) hours as needed for nausea or vomiting. (Patient not taking: No sig reported), Disp: 20 tablet, Rfl: 0   potassium chloride SA (KLOR-CON) 20 MEQ tablet, Take 1 tablet (20 mEq total) by mouth 2 (two) times  daily for 5 days., Disp: 10 tablet, Rfl: 0   predniSONE (STERAPRED UNI-PAK 21 TAB) 10 MG (21) TBPK tablet, Take by mouth daily. Take 6 tabs by mouth daily  for 1 days, then 5 tabs for 1 days, then 4 tabs for 1 days, then 3 tabs for 1 days, 2 tabs for 1 days, then 1 tab by mouth daily for 1 days, Disp: 21 tablet, Rfl: 0   terbinafine (LAMISIL) 250 MG tablet, Take 1 tablet (250 mg total) by mouth daily., Disp: 90 tablet, Rfl: 0   ULTRAM 50 MG tablet, Take 50 mg by mouth every 8 (eight) hours., Disp: , Rfl:   Social History   Tobacco Use  Smoking Status Never  Smokeless Tobacco Never    Allergies  Allergen Reactions   Tramadol    Objective:  There were  no vitals filed for this visit. There is no height or weight on file to calculate BMI. Constitutional Well developed. Well nourished.  Vascular Dorsalis pedis pulses palpable bilaterally. Posterior tibial pulses palpable bilaterally. Capillary refill normal to all digits.  No cyanosis or clubbing noted. Pedal hair growth normal.  Neurologic Normal speech. Oriented to person, place, and time. Epicritic sensation to light touch grossly present bilaterally.  Dermatologic Thickened elongated dystrophic mycotic discolored nail noted x10.  Mild pain on palpation.  Plantar foot skin epidermal lysis noted with subjective complaint of itching. No open wounds. No skin lesions.  Orthopedic: Gait examination shows pes planovalgus foot structure with calcaneovalgus to many toe signs unable to recruit the arch with dorsiflexion of the hallux.  Unable to perform single and double heel raise.   Radiographs: 3 views of skeletally mature adult bilateral foot: There is decreasing calcaneal inclination angle increasing talar declination angle midfoot arthritis noted.  Mild elevatus present.  No other bony abnormalities identified. Assessment:   1. Pes planovalgus   2. Encounter for long-term current use of medication   3. Tinea pedis of both feet    Plan:  Patient was evaluated and treated and all questions answered.  Bilateral onychomycosis x10 -Educated the patient on the etiology of onychomycosis and various treatment options associated with improving the fungal load.  I explained to the patient that there is 3 treatment options available to treat the onychomycosis including topical, p.o., laser treatment.  Patient elected to undergo p.o. options with Lamisil/terbinafine therapy.  In order for me to start the medication therapy, I explained to the patient the importance of evaluating the liver and obtaining the liver function test.  Once the liver function test comes back normal I will start him on  41-monthcourse of Lamisil therapy.  Patient understood all risk and would like to proceed with Lamisil therapy.  I have asked the patient to immediately stop the Lamisil therapy if she has any reactions to it and call the office or go to the emergency room right away.  Patient states understanding  Bilateral athlete's foot -I explained to the patient the etiology of athlete's foot and various treatment options were discussed.  Given that she has failed all the over-the-counter treatment options I believe she would benefit from prescription lotion.  She was given Lotrisone cream.  I have asked her to apply twice a day.  She states understanding  Pes planovalgus -I explained to patient the etiology of pes planovalgus and worse treatment options were discussed.  Given that she is having arch and heel related pain in the setting of fixed pes planovalgus nonflexible I believe she  would benefit from custom-made orthotics to help control the hindfoot motion support the arch of the foot.  She states understanding -She was casted for orthotics  No follow-ups on file.

## 2020-09-27 DIAGNOSIS — R569 Unspecified convulsions: Secondary | ICD-10-CM | POA: Insufficient documentation

## 2020-09-27 DIAGNOSIS — M170 Bilateral primary osteoarthritis of knee: Secondary | ICD-10-CM | POA: Insufficient documentation

## 2020-09-27 DIAGNOSIS — Z23 Encounter for immunization: Secondary | ICD-10-CM | POA: Diagnosis not present

## 2020-09-27 DIAGNOSIS — G4733 Obstructive sleep apnea (adult) (pediatric): Secondary | ICD-10-CM | POA: Diagnosis not present

## 2020-09-27 DIAGNOSIS — M545 Low back pain, unspecified: Secondary | ICD-10-CM | POA: Diagnosis not present

## 2020-10-09 ENCOUNTER — Other Ambulatory Visit: Payer: Self-pay

## 2020-10-09 ENCOUNTER — Ambulatory Visit (INDEPENDENT_AMBULATORY_CARE_PROVIDER_SITE_OTHER): Payer: BC Managed Care – PPO

## 2020-10-09 DIAGNOSIS — Q666 Other congenital valgus deformities of feet: Secondary | ICD-10-CM

## 2020-10-09 NOTE — Progress Notes (Signed)
Patient presents for orthotic pick up.  Verbal and written break in and wear instructions given.  Patient will follow up in 4 weeks with Dr if symptoms worsen or fail to improve. 

## 2020-10-09 NOTE — Patient Instructions (Signed)

## 2020-10-20 ENCOUNTER — Other Ambulatory Visit (HOSPITAL_BASED_OUTPATIENT_CLINIC_OR_DEPARTMENT_OTHER): Payer: Self-pay

## 2020-10-20 DIAGNOSIS — G4733 Obstructive sleep apnea (adult) (pediatric): Secondary | ICD-10-CM

## 2020-10-31 DIAGNOSIS — Z136 Encounter for screening for cardiovascular disorders: Secondary | ICD-10-CM | POA: Diagnosis not present

## 2020-10-31 DIAGNOSIS — Z0001 Encounter for general adult medical examination with abnormal findings: Secondary | ICD-10-CM | POA: Diagnosis not present

## 2020-10-31 DIAGNOSIS — R7303 Prediabetes: Secondary | ICD-10-CM | POA: Diagnosis not present

## 2020-11-01 ENCOUNTER — Ambulatory Visit (HOSPITAL_BASED_OUTPATIENT_CLINIC_OR_DEPARTMENT_OTHER): Payer: BC Managed Care – PPO | Attending: Internal Medicine | Admitting: Internal Medicine

## 2020-11-01 ENCOUNTER — Other Ambulatory Visit: Payer: Self-pay

## 2020-11-01 DIAGNOSIS — G4733 Obstructive sleep apnea (adult) (pediatric): Secondary | ICD-10-CM | POA: Insufficient documentation

## 2020-11-06 DIAGNOSIS — M1712 Unilateral primary osteoarthritis, left knee: Secondary | ICD-10-CM | POA: Diagnosis not present

## 2020-11-06 DIAGNOSIS — Z0001 Encounter for general adult medical examination with abnormal findings: Secondary | ICD-10-CM | POA: Diagnosis not present

## 2020-11-06 DIAGNOSIS — Z124 Encounter for screening for malignant neoplasm of cervix: Secondary | ICD-10-CM | POA: Diagnosis not present

## 2020-11-06 DIAGNOSIS — E785 Hyperlipidemia, unspecified: Secondary | ICD-10-CM | POA: Diagnosis not present

## 2020-11-06 DIAGNOSIS — Z6838 Body mass index (BMI) 38.0-38.9, adult: Secondary | ICD-10-CM | POA: Diagnosis not present

## 2020-11-06 DIAGNOSIS — Z23 Encounter for immunization: Secondary | ICD-10-CM | POA: Diagnosis not present

## 2020-11-06 DIAGNOSIS — G4733 Obstructive sleep apnea (adult) (pediatric): Secondary | ICD-10-CM | POA: Diagnosis not present

## 2020-11-06 DIAGNOSIS — Z1211 Encounter for screening for malignant neoplasm of colon: Secondary | ICD-10-CM | POA: Diagnosis not present

## 2020-11-09 ENCOUNTER — Ambulatory Visit: Payer: BC Managed Care – PPO | Admitting: Podiatry

## 2020-11-10 ENCOUNTER — Other Ambulatory Visit: Payer: Self-pay | Admitting: Internal Medicine

## 2020-11-10 DIAGNOSIS — Z1231 Encounter for screening mammogram for malignant neoplasm of breast: Secondary | ICD-10-CM

## 2020-11-11 DIAGNOSIS — G4733 Obstructive sleep apnea (adult) (pediatric): Secondary | ICD-10-CM | POA: Diagnosis not present

## 2020-11-11 NOTE — Procedures (Signed)
   Patient Name: Madison Armstrong, [place] Date: 11/01/2020 Gender: Female D.O.B: [birth year] Age (years): 76 Referring Provider: Latanya Presser MD Height (inches): 32 Interpreting Physician: Baird Lyons MD, ABSM Weight (lbs): 260 RPSGT: Jacolyn Reedy BMI: 38 MRN: [id number] Neck Size: <br>  CLINICAL INFORMATION Sleep Study Type: HST Indication for sleep study: Snoring Epworth Sleepiness Score: 12  SLEEP STUDY TECHNIQUE A multi-channel overnight portable sleep study was performed. The channels recorded were: nasal airflow, thoracic respiratory movement, and oxygen saturation with a pulse oximetry. Snoring was also monitored.  MEDICATIONS Patient self administered medications include: none reported.  SLEEP ARCHITECTURE Patient was studied for 430.2 minutes. The sleep efficiency was 100.0 % and the patient was supine for 98%. The arousal index was 0.0 per hour.  RESPIRATORY PARAMETERS The overall AHI was 30.0 per hour, with a central apnea index of 0 per hour. The oxygen nadir was 71% during sleep.  CARDIAC DATA Mean heart rate during sleep was 80.9 bpm.  IMPRESSIONS - Severe obstructive sleep apnea occurred during this study (AHI = 30.0/h). - Oxygen desaturation was noted during this study (Min O2 = 71%). Mean O2 saturation 93% - Patient snored.  DIAGNOSIS - Obstructive Sleep Apnea (G47.33)  RECOMMENDATIONS - Suggest CPAP titration sleeep study or autopap. Other options would be based on clinical judgment. - Be careful with alcohol, sedatives and other CNS depressants that may worsen sleep apnea and disrupt normal sleep architecture. - Sleep hygiene should be reviewed to assess factors that may improve sleep quality. - Weight management and regular exercise should be initiated or continued.  [Electronically signed] 11/11/2020 12:11 PM  Baird Lyons MD, [place], American Board of Sleep Medicine   NPI: [id number]                           [place], [place] [facility]  ELECTRONICALLY SIGNED ON:  11/11/2020, 12:08 PM [place] PH: [phone]   FX: [phone] [place]

## 2020-11-15 DIAGNOSIS — G4733 Obstructive sleep apnea (adult) (pediatric): Secondary | ICD-10-CM | POA: Diagnosis not present

## 2020-11-20 DIAGNOSIS — E119 Type 2 diabetes mellitus without complications: Secondary | ICD-10-CM | POA: Diagnosis not present

## 2020-11-20 DIAGNOSIS — R0789 Other chest pain: Secondary | ICD-10-CM | POA: Insufficient documentation

## 2020-11-20 DIAGNOSIS — R002 Palpitations: Secondary | ICD-10-CM | POA: Insufficient documentation

## 2020-11-20 DIAGNOSIS — R0602 Shortness of breath: Secondary | ICD-10-CM | POA: Insufficient documentation

## 2020-11-20 DIAGNOSIS — R079 Chest pain, unspecified: Secondary | ICD-10-CM | POA: Diagnosis not present

## 2020-11-20 DIAGNOSIS — R600 Localized edema: Secondary | ICD-10-CM | POA: Diagnosis not present

## 2020-11-21 ENCOUNTER — Encounter (HOSPITAL_COMMUNITY): Payer: Self-pay | Admitting: Emergency Medicine

## 2020-11-21 ENCOUNTER — Emergency Department (HOSPITAL_COMMUNITY): Payer: BC Managed Care – PPO

## 2020-11-21 ENCOUNTER — Emergency Department (HOSPITAL_COMMUNITY)
Admission: EM | Admit: 2020-11-21 | Discharge: 2020-11-21 | Disposition: A | Discharge status: Home / Self Care | Payer: BC Managed Care – PPO | Attending: Emergency Medicine | Admitting: Emergency Medicine

## 2020-11-21 ENCOUNTER — Other Ambulatory Visit: Payer: Self-pay

## 2020-11-21 DIAGNOSIS — R079 Chest pain, unspecified: Secondary | ICD-10-CM | POA: Diagnosis not present

## 2020-11-21 DIAGNOSIS — R0789 Other chest pain: Secondary | ICD-10-CM

## 2020-11-21 LAB — CBC WITH DIFFERENTIAL/PLATELET
Abs Immature Granulocytes: 0.03 10*3/uL (ref 0.00–0.07)
Basophils Absolute: 0.1 10*3/uL (ref 0.0–0.1)
Basophils Relative: 1 %
Eosinophils Absolute: 0.2 10*3/uL (ref 0.0–0.5)
Eosinophils Relative: 2 %
HCT: 37.6 % (ref 36.0–46.0)
Hemoglobin: 12.4 g/dL (ref 12.0–15.0)
Immature Granulocytes: 0 %
Lymphocytes Relative: 24 %
Lymphs Abs: 2.2 10*3/uL (ref 0.7–4.0)
MCH: 30.3 pg (ref 26.0–34.0)
MCHC: 33 g/dL (ref 30.0–36.0)
MCV: 91.9 fL (ref 80.0–100.0)
Monocytes Absolute: 0.7 10*3/uL (ref 0.1–1.0)
Monocytes Relative: 8 %
Neutro Abs: 6 10*3/uL (ref 1.7–7.7)
Neutrophils Relative %: 65 %
Platelets: 383 10*3/uL (ref 150–400)
RBC: 4.09 MIL/uL (ref 3.87–5.11)
RDW: 12.4 % (ref 11.5–15.5)
WBC: 9.2 10*3/uL (ref 4.0–10.5)
nRBC: 0 % (ref 0.0–0.2)

## 2020-11-21 LAB — COMPREHENSIVE METABOLIC PANEL
ALT: 22 U/L (ref 0–44)
AST: 20 U/L (ref 15–41)
Albumin: 3.5 g/dL (ref 3.5–5.0)
Alkaline Phosphatase: 71 U/L (ref 38–126)
Anion gap: 10 (ref 5–15)
BUN: 14 mg/dL (ref 6–20)
CO2: 25 mmol/L (ref 22–32)
Calcium: 8.8 mg/dL — ABNORMAL LOW (ref 8.9–10.3)
Chloride: 103 mmol/L (ref 98–111)
Creatinine, Ser: 0.93 mg/dL (ref 0.44–1.00)
GFR, Estimated: >60 mL/min (ref >60)
Glucose, Bld: 103 mg/dL — ABNORMAL HIGH (ref 70–99)
Potassium: 3.6 mmol/L (ref 3.5–5.1)
Sodium: 138 mmol/L (ref 135–145)
Total Bilirubin: 0.7 mg/dL (ref 0.3–1.2)
Total Protein: 7.7 g/dL (ref 6.5–8.1)

## 2020-11-21 LAB — LIPASE, BLOOD: Lipase: 30 U/L (ref 11–51)

## 2020-11-21 LAB — D-DIMER, QUANTITATIVE: D-Dimer, Quant: 0.54 ug/mL-FEU — ABNORMAL HIGH (ref 0.00–0.50)

## 2020-11-21 LAB — TROPONIN I (HIGH SENSITIVITY)
Troponin I (High Sensitivity): 3 ng/L (ref <18)
Troponin I (High Sensitivity): 3 ng/L (ref <18)

## 2020-11-21 MED ORDER — IOHEXOL 350 MG/ML SOLN
60.0000 mL | Freq: Once | INTRAVENOUS | Status: AC | PRN
  Administered 2020-11-21: 60 mL via INTRAVENOUS

## 2020-11-21 NOTE — ED Notes (Signed)
Pt verbalized understanding of d/c instructions, meds and followup care. Denies questions. VSS, no distress noted. Steady gait to exit with all belongings.  ?

## 2020-11-21 NOTE — ED Triage Notes (Signed)
Patient reports left chest pain with SOB radiating to left arm/neck/shoulder and upper back , no emesis or diaphoresis , she added epigastric pain .

## 2020-11-21 NOTE — ED Provider Notes (Signed)
[facility] EMERGENCY DEPARTMENT Provider Note   CSN: [id number] Arrival date & time: 11/20/20  2144     History Chief Complaint  Patient presents with   Chest Pain    Madison Armstrong is a 55 y.o. female.  HPI     This is a 55 year old female with a history of diabetes who presents with chest pain.  Patient reports she had onset of some epigastric and "stomach pain starting on Sunday.  Those have been on and off for the last 2 days.  However last night she had increasing chest discomfort that radiated into her left shoulder.  She describes it as pressure and tightness.  She does report intermittent palpitations and shortness of breath as well.  She has intermittent right lower extremity swelling with no history of DVT or PE.  No known history of heart disease.  Currently she is not having pain.  Past Medical History:  Diagnosis Date   Chicken pox    Diabetes mellitus without complication [facility])     Patient Active Problem List   Diagnosis Date Noted   Multiple somatic complaints 09/13/2015    Past Surgical History:  Procedure Laterality Date   ABDOMINAL HYSTERECTOMY     COLONOSCOPY WITH PROPOFOL N/A 08/31/2014   Procedure: COLONOSCOPY WITH PROPOFOL;  Surgeon: Manya Silvas, MD;  Location: [facility];  Service: Endoscopy;  Laterality: N/A;   ESOPHAGOGASTRODUODENOSCOPY (EGD) WITH PROPOFOL N/A 08/31/2014   Procedure: ESOPHAGOGASTRODUODENOSCOPY (EGD) WITH PROPOFOL;  Surgeon: Manya Silvas, MD;  Location: [facility] ENDOSCOPY;  Service: Endoscopy;  Laterality: N/A;   nipple removal       OB History     Gravida  2   Para  2   Term  2   Preterm      AB      Living  2      SAB      IAB      Ectopic      Multiple      Live Births  2           Family History  Problem Relation Age of Onset   Stroke Mother    Hypertension Mother    Hypertension Father    Ovarian cancer Maternal Grandmother    Stroke Maternal Grandfather    Throat  cancer Paternal Grandmother    Heart disease Paternal Grandfather    Heart disease Brother        Passed away at 82    Social History   Tobacco Use   Smoking status: Never   Smokeless tobacco: Never  Substance Use Topics   Alcohol use: No   Drug use: No    Home Medications Prior to Admission medications   Medication Sig Start Date End Date Taking? Authorizing Provider  albuterol (VENTOLIN HFA) 108 (90 Base) MCG/ACT inhaler Inhale 1-2 puffs into the lungs every 6 (six) hours as needed for wheezing or shortness of breath. 11/24/19   Avegno, Darrelyn Hillock, FNP  benzonatate (TESSALON) 100 MG capsule Take 1 capsule (100 mg total) by mouth every 8 (eight) hours. 11/19/19   Avegno, Darrelyn Hillock, FNP  CELEBREX 200 MG capsule Take 200 mg by mouth 2 (two) times daily. 02/16/19   [provider]  cetirizine (ZYRTEC ALLERGY) 10 MG tablet Take 1 tablet (10 mg total) by mouth daily. 11/19/19   Emerson Monte, FNP  Cholecalciferol 125 MCG (5000 UT) TABS Take 5,000 Units by mouth daily.    [provider]  clotrimazole-betamethasone (LOTRISONE) cream Apply 1 application topically daily. 09/07/20   Felipa Furnace, DPM  cyclobenzaprine (FLEXERIL) 10 MG tablet Take 10 mg by mouth at bedtime. 01/27/19   [provider]  cyclobenzaprine (FLEXERIL) 5 MG tablet Take 1 tablet (5 mg total) by mouth 3 (three) times daily as needed for muscle spasms. Patient not taking: No sig reported 08/27/17   Alfred Levins, [place], MD  estradiol (VIVELLE-DOT) 0.025 MG/24HR Place 1 patch onto the skin 2 (two) times a week. 01/25/19   [provider]  fluticasone (FLONASE) 50 MCG/ACT nasal spray Place 1 spray into both nostrils daily for 14 days. 11/19/19 12/03/19  Avegno, Darrelyn Hillock, FNP  levETIRAcetam (KEPPRA) 500 MG tablet Take 1 tablet (500 mg total) by mouth 2 (two) times daily. 02/19/19   Pattricia Boss, MD  lidocaine (LIDODERM) 5 % Place 1 patch onto the skin every 12 (twelve) hours. 01/27/19    [provider]  Magnesium Oxide (MAG-OXIDE) 200 MG TABS Take 1 tablet (200 mg total) by mouth daily. 02/19/19   Sherwood Gambler, MD  meclizine (ANTIVERT) 25 MG tablet Take 1 tablet (25 mg total) by mouth 3 (three) times daily as needed for dizziness. 02/19/19   Sherwood Gambler, MD  ondansetron (ZOFRAN ODT) 4 MG disintegrating tablet Take 1 tablet (4 mg total) by mouth every 8 (eight) hours as needed for nausea or vomiting. Patient not taking: No sig reported 08/27/17   Alfred Levins, [place], MD  potassium chloride SA (KLOR-CON) 20 MEQ tablet Take 1 tablet (20 mEq total) by mouth 2 (two) times daily for 5 days. 02/19/19 02/24/19  Sherwood Gambler, MD  predniSONE (STERAPRED UNI-PAK 21 TAB) 10 MG (21) TBPK tablet Take by mouth daily. Take 6 tabs by mouth daily  for 1 days, then 5 tabs for 1 days, then 4 tabs for 1 days, then 3 tabs for 1 days, 2 tabs for 1 days, then 1 tab by mouth daily for 1 days 11/24/19   Avegno, Darrelyn Hillock, FNP  terbinafine (LAMISIL) 250 MG tablet Take 1 tablet (250 mg total) by mouth daily. 09/11/20   Felipa Furnace, DPM  ULTRAM 50 MG tablet Take 50 mg by mouth every 8 (eight) hours. 02/16/19   [provider]    Allergies    Tramadol  Review of Systems   Review of Systems  Constitutional:  Negative for fever.  Respiratory:  Positive for chest tightness. Negative for cough.   Cardiovascular:  Positive for chest pain, palpitations and leg swelling.  Gastrointestinal:  Positive for abdominal pain. Negative for diarrhea, nausea and vomiting.  All other systems reviewed and are negative.  Physical Exam Updated Vital Signs BP 125/66   Pulse 63   Temp 98 F (36.7 C) (Oral)   Resp 15   Ht 1.753 m (5\' 9" )   Wt 125 kg   SpO2 100%   BMI 40.70 kg/m   Physical Exam Vitals and nursing note reviewed.  Constitutional:      Appearance: She is well-developed. She is obese. She is not ill-appearing.  HENT:     Head: Normocephalic and atraumatic.  Eyes:     Pupils:  Pupils are equal, round, and reactive to light.  Cardiovascular:     Rate and Rhythm: Normal rate and regular rhythm.     Heart sounds: Normal heart sounds.  Pulmonary:     Effort: Pulmonary effort is normal. No respiratory distress.     Breath sounds: No wheezing.  Abdominal:  General: Bowel sounds are normal.     Palpations: Abdomen is soft.  Musculoskeletal:     Cervical back: Neck supple.     Right lower leg: Edema present.     Left lower leg: Edema present.     Comments: Trace bilateral lower extremity edema  Skin:    General: Skin is warm and dry.  Neurological:     Mental Status: She is alert and oriented to person, place, and time.  Psychiatric:        Mood and Affect: Mood normal.    ED Results / Procedures / Treatments   Labs (all labs ordered are listed, but only abnormal results are displayed) Labs Reviewed  COMPREHENSIVE METABOLIC PANEL - Abnormal; Notable for the following components:      Result Value   Glucose, Bld 103 (*)    Calcium 8.8 (*)    All other components within normal limits  D-DIMER, QUANTITATIVE - Abnormal; Notable for the following components:   D-Dimer, Quant 0.54 (*)    All other components within normal limits  CBC WITH DIFFERENTIAL/PLATELET  LIPASE, BLOOD  TROPONIN I (HIGH SENSITIVITY)  TROPONIN I (HIGH SENSITIVITY)    EKG EKG Interpretation  Date/Time:  Tuesday November 21 2020 01:08:26 EST Ventricular Rate:  78 PR Interval:  148 QRS Duration: 68 QT Interval:  408 QTC Calculation: 465 R Axis:   53 Text Interpretation: Normal sinus rhythm Low voltage QRS Borderline ECG No significant change since last tracing Confirmed by Addison Lank [phone]) on 11/21/2020 2:07:16 AM  Radiology DG Chest 2 View  Result Date: 11/21/2020 CLINICAL DATA:  Chest pain. EXAM: CHEST - 2 VIEW COMPARISON:  Chest radiograph dated 11/24/2019 FINDINGS: No focal consolidation, pleural effusion or pneumothorax. The cardiac silhouette is within limits. No  acute osseous pathology. IMPRESSION: No active cardiopulmonary disease. Electronically Signed   By: Anner Crete M.D.   On: 11/21/2020 01:44   CT Angio Chest PE W and/or Wo Contrast  Result Date: 11/21/2020 CLINICAL DATA:  Chest pain EXAM: CT ANGIOGRAPHY CHEST WITH CONTRAST TECHNIQUE: Multidetector CT imaging of the chest was performed using the standard protocol during bolus administration of intravenous contrast. Multiplanar CT image reconstructions and MIPs were obtained to evaluate the vascular anatomy. CONTRAST:  69mL OMNIPAQUE IOHEXOL 350 MG/ML SOLN COMPARISON:  None. FINDINGS: Cardiovascular: Suboptimal (mainly from bolus dispersion) but satisfactory opacification of the pulmonary arteries to the segmental level. No evidence of pulmonary embolism. Normal heart size. No pericardial effusion. Mediastinum/Nodes: Negative for adenopathy. Lungs/Pleura: There is no edema, consolidation, effusion, or pneumothorax. No worrisome pulmonary nodule. Upper Abdomen: Negative Musculoskeletal: Negative Review of the MIP images confirms the above findings. IMPRESSION: Negative for pulmonary embolism. Electronically Signed   By: Jorje Guild M.D.   On: 11/21/2020 06:31    Procedures Procedures   Medications Ordered in ED Medications  iohexol (OMNIPAQUE) 350 MG/ML injection 60 mL (60 mLs Intravenous Contrast Given 11/21/20 8466)    ED Course  I have reviewed the triage vital signs and the nursing notes.  Pertinent labs & imaging results that were available during my care of the patient were reviewed by me and considered in my medical decision making (see chart for details).    MDM Rules/Calculators/A&P                           Patient presents with chest pain.  She is nontoxic-appearing and vital signs are reassuring.  She has risk factors including diabetes  and age.  She also has an early family history.  EKG is without acute ischemic or arrhythmic changes and troponin x2 is negative.  She is  fairly low risk.  Chest x-ray shows no evidence of pneumothorax or pneumonia.  Screening D-dimer for PE was obtained and slightly positive.  For this reason CT PE study was obtained.  This shows no evidence of pulmonary embolism.  Patient has remained hemodynamically stable.  Feel she is safe for outpatient cardiology evaluation and stress testing.  Discussed this with the patient who is comfortable with plan.  After history, exam, and medical workup I feel the patient has been appropriately medically screened and is safe for discharge home. Pertinent diagnoses were discussed with the patient. Patient was given return precautions.  Final Clinical Impression(s) / ED Diagnoses Final diagnoses:  Atypical chest pain    Rx / DC Orders ED Discharge Orders     None        Kerie Badger, Barbette Hair, MD 11/21/20 [phone]

## 2020-11-21 NOTE — ED Provider Notes (Signed)
Emergency Medicine Provider Triage Evaluation Note  LAYZA SUMMA , a 55 y.o. female  was evaluated in triage.  Pt complains of chest pain that began last night. Pain initially in the epigastric but then additionally began in the left chest, feels like tightness, associated dyspnea & palpitations.   Review of Systems  Positive: Dyspnea, chest pain, abdominal pain Negative: Vomiting, diarrhea, syncope, leg pain/swelling.   Physical Exam  Ht 5\' 9"  (1.753 m)   Wt 125 kg   BMI 40.70 kg/m  Gen:   Awake, no distress   Resp:  Normal effort  MSK:   Moves extremities without difficulty  Other:  No peritoneal signs on abdominal exam, mild epigastric tenderness.    Medical Decision Making  Medically screening exam initiated at 1:03 AM.  Appropriate orders placed.  NYKERIA MEALING was informed that the remainder of the evaluation will be completed by another provider, this initial triage assessment does not replace that evaluation, and the importance of remaining in the ED until their evaluation is complete.  Chest & abdominal pain.    Amaryllis Dyke, PA-C 11/21/20 0105    Fatima Blank, MD 11/21/20 [phone]

## 2020-11-21 NOTE — Discharge Instructions (Signed)
You were seen today for chest pain.  Your work-up is reassuring including cardiac testing and PE study.  Given your risk factors and age, follow-up with cardiology for definitive outpatient testing.

## 2020-12-04 DIAGNOSIS — R1013 Epigastric pain: Secondary | ICD-10-CM | POA: Diagnosis not present

## 2020-12-04 DIAGNOSIS — R0789 Other chest pain: Secondary | ICD-10-CM | POA: Diagnosis not present

## 2020-12-04 DIAGNOSIS — Z6838 Body mass index (BMI) 38.0-38.9, adult: Secondary | ICD-10-CM | POA: Diagnosis not present

## 2020-12-11 DIAGNOSIS — M25562 Pain in left knee: Secondary | ICD-10-CM | POA: Diagnosis not present

## 2020-12-11 DIAGNOSIS — M1712 Unilateral primary osteoarthritis, left knee: Secondary | ICD-10-CM | POA: Diagnosis not present

## 2020-12-13 ENCOUNTER — Encounter: Payer: Self-pay | Admitting: Cardiovascular Disease

## 2020-12-13 ENCOUNTER — Ambulatory Visit: Payer: BC Managed Care – PPO | Admitting: Cardiovascular Disease

## 2020-12-13 ENCOUNTER — Other Ambulatory Visit: Payer: Self-pay

## 2020-12-13 VITALS — BP 140/70 | HR 74 | Ht 69.0 in | Wt 255.1 lb

## 2020-12-13 DIAGNOSIS — R079 Chest pain, unspecified: Secondary | ICD-10-CM | POA: Diagnosis not present

## 2020-12-13 DIAGNOSIS — E782 Mixed hyperlipidemia: Secondary | ICD-10-CM

## 2020-12-13 DIAGNOSIS — I10 Essential (primary) hypertension: Secondary | ICD-10-CM

## 2020-12-13 DIAGNOSIS — R002 Palpitations: Secondary | ICD-10-CM

## 2020-12-13 NOTE — Progress Notes (Signed)
Cardiology Office Note  Date:  12/13/2020   ID:  Madison Armstrong, DOB [birth year], MRN [id number]  PCP:  Audley Hose, MD   Chief Complaint  Patient presents with   New Patient (Initial Visit)    Ref by Dr. Dina Rich for evaluation of chest pain; was @ [facility] ER with chest pain. Medications reviewed by the patient verbally.     HPI:  Madison Armstrong is a 55 year old woman with past medical history of Diabetes OSA on CPP, 3 wks Who presents by referral from Dr.  Maia Petties for consultation of her chest pain  Seen in the emergency room November 21, 2020 onset of some epigastric and "stomach pain on and off for the last 2 days.  Then she developed increasing chest discomfort that radiated into her left shoulder.  Described as pressure and tightness.   Also with symptoms of tingling left neck, neck pain, diaphoresis Had 4 episodes of short-lived palpitations and shortness of breath In the ER was not having active chest pain   CT scan chest performed, no acute findings Images pulled up and reviewed on today's visit, no significant coronary calcification or aortic atherosclerosis, no carotid artery calcification  Troponin negative, EKG nonacute  Since then has been feeling relatively well, no recurrence of her symptoms In general feels well since she started her CPAP 3 weeks ago, sleeping much better, energy better  Lab work reviewed A1c 5.6 No recent lipid panel Numbers from 4 years ago total cholesterol 183 LDL 117  EKG personally reviewed by myself on todays visit Normal sinus rhythm rate 74 bpm no significant ST-T wave changes  PMH:   has a past medical history of Chicken pox and Diabetes mellitus without complication ([place]).  PSH:    Past Surgical History:  Procedure Laterality Date   ABDOMINAL HYSTERECTOMY     COLONOSCOPY WITH PROPOFOL N/A 08/31/2014   Procedure: COLONOSCOPY WITH PROPOFOL;  Surgeon: Manya Silvas, MD;  Location: [facility] ENDOSCOPY;  Service: Endoscopy;   Laterality: N/A;   ESOPHAGOGASTRODUODENOSCOPY (EGD) WITH PROPOFOL N/A 08/31/2014   Procedure: ESOPHAGOGASTRODUODENOSCOPY (EGD) WITH PROPOFOL;  Surgeon: Manya Silvas, MD;  Location: [facility] ENDOSCOPY;  Service: Endoscopy;  Laterality: N/A;   nipple removal      Current Outpatient Medications  Medication Sig Dispense Refill   albuterol (VENTOLIN HFA) 108 (90 Base) MCG/ACT inhaler Inhale 1-2 puffs into the lungs every 6 (six) hours as needed for wheezing or shortness of breath. 18 g 1   CELEBREX 200 MG capsule Take 200 mg by mouth 2 (two) times daily.     cetirizine (ZYRTEC ALLERGY) 10 MG tablet Take 1 tablet (10 mg total) by mouth daily. 30 tablet 0   estradiol (VIVELLE-DOT) 0.025 MG/24HR Place 1 patch onto the skin 2 (two) times a week.     fluticasone (FLONASE) 50 MCG/ACT nasal spray Place 1 spray into both nostrils daily for 14 days. 16 g 0   meclizine (ANTIVERT) 25 MG tablet Take 1 tablet (25 mg total) by mouth 3 (three) times daily as needed for dizziness. 15 tablet 0   benzonatate (TESSALON) 100 MG capsule Take 1 capsule (100 mg total) by mouth every 8 (eight) hours. (Patient not taking: Reported on 12/13/2020) 30 capsule 0   Cholecalciferol 125 MCG (5000 UT) TABS Take 5,000 Units by mouth daily. (Patient not taking: Reported on 12/13/2020)     clotrimazole-betamethasone (LOTRISONE) cream Apply 1 application topically daily. (Patient not taking: Reported on 12/13/2020) 30 g 2   cyclobenzaprine (FLEXERIL)  10 MG tablet Take 10 mg by mouth at bedtime. (Patient not taking: Reported on 12/13/2020)     cyclobenzaprine (FLEXERIL) 5 MG tablet Take 1 tablet (5 mg total) by mouth 3 (three) times daily as needed for muscle spasms. (Patient not taking: Reported on 02/19/2019) 30 tablet 0   levETIRAcetam (KEPPRA) 500 MG tablet Take 1 tablet (500 mg total) by mouth 2 (two) times daily. (Patient not taking: Reported on 12/13/2020) 60 tablet 0   lidocaine (LIDODERM) 5 % Place 1 patch onto the skin every 12  (twelve) hours. (Patient not taking: Reported on 12/13/2020)     Magnesium Oxide (MAG-OXIDE) 200 MG TABS Take 1 tablet (200 mg total) by mouth daily. (Patient not taking: Reported on 12/13/2020) 5 tablet 0   ondansetron (ZOFRAN ODT) 4 MG disintegrating tablet Take 1 tablet (4 mg total) by mouth every 8 (eight) hours as needed for nausea or vomiting. (Patient not taking: Reported on 02/19/2019) 20 tablet 0   potassium chloride SA (KLOR-CON) 20 MEQ tablet Take 1 tablet (20 mEq total) by mouth 2 (two) times daily for 5 days. (Patient not taking: Reported on 12/13/2020) 10 tablet 0   predniSONE (STERAPRED UNI-PAK 21 TAB) 10 MG (21) TBPK tablet Take by mouth daily. Take 6 tabs by mouth daily  for 1 days, then 5 tabs for 1 days, then 4 tabs for 1 days, then 3 tabs for 1 days, 2 tabs for 1 days, then 1 tab by mouth daily for 1 days (Patient not taking: Reported on 12/13/2020) 21 tablet 0   terbinafine (LAMISIL) 250 MG tablet Take 1 tablet (250 mg total) by mouth daily. (Patient not taking: Reported on 12/13/2020) 90 tablet 0   ULTRAM 50 MG tablet Take 50 mg by mouth every 8 (eight) hours. (Patient not taking: Reported on 12/13/2020)     No current facility-administered medications for this visit.     Allergies:   Tramadol   Social History:  The patient  reports that she has never smoked. She has never used smokeless tobacco. She reports that she does not drink alcohol and does not use drugs.   Family History:   family history includes Heart disease in her brother and paternal grandfather; Hypertension in her father and mother; Ovarian cancer in her maternal grandmother; Stroke in her maternal grandfather and mother; Throat cancer in her paternal grandmother.    Review of Systems: Review of Systems  Constitutional: Negative.   HENT: Negative.    Respiratory: Negative.    Cardiovascular:  Positive for chest pain.  Gastrointestinal: Negative.   Musculoskeletal: Negative.   Neurological: Negative.    Psychiatric/Behavioral: Negative.    All other systems reviewed and are negative.   PHYSICAL EXAM: VS:  BP 140/70 (BP Location: Right Arm, Patient Position: Sitting, Cuff Size: Large)   Pulse 74   Ht 5\' 9"  (1.753 m)   Wt 255 lb 2 oz (115.7 kg)   SpO2 98%   BMI 37.68 kg/m  , BMI Body mass index is 37.68 kg/m. GEN: Well nourished, well developed, in no acute distress HEENT: normal Neck: no JVD, carotid bruits, or masses Cardiac: RRR; no murmurs, rubs, or gallops,no edema  Respiratory:  clear to auscultation bilaterally, normal work of breathing GI: soft, nontender, nondistended, + BS MS: no deformity or atrophy Skin: warm and dry, no rash Neuro:  Strength and sensation are intact Psych: euthymic mood, full affect   Recent Labs: 11/21/2020: ALT 22; BUN 14; Creatinine, Ser 0.93; Hemoglobin 12.4; Platelets 383;  Potassium 3.6; Sodium 138    Lipid Panel No results found for: CHOL, HDL, LDLCALC, TRIG    Wt Readings from Last 3 Encounters:  12/13/20 255 lb 2 oz (115.7 kg)  11/21/20 275 lb 9.2 oz (125 kg)  11/01/20 260 lb (117.9 kg)       ASSESSMENT AND PLAN:  Problem List Items Addressed This Visit   None Visit Diagnoses     Chest pain of uncertain etiology    -  Primary   Morbid obesity ([place])       Essential hypertension       Mixed hyperlipidemia       Palpitations          Preop cardiovascular evaluation Planning on knee surgery in the near future, no further cardiac testing needed Low risk, details below  Chest pain Normal EKG, cardiac enzymes negative, atypical in nature No further episodes since seen in the emergency room Symptoms at rest Has been active since that time, feeling well with no complaints Chest CT scan pulled up images reviewed no coronary calcification, no aortic atherosclerosis, no carotid calcification -Few risk factors, no smoking history, cholesterol very reasonable, A1c well controlled -Discussed options with her, no further testing  ordered at this time  Obesity We have encouraged continued exercise, careful diet management in an effort to lose weight.    Total encounter time more than 60 minutes  Greater than 50% was spent in counseling and coordination of care with the patient  Patient seen in consultation for Dr. Maia Petties be referred back to her office for ongoing care of the issues detailed above  Signed, Esmond Plants, M.D., Ph.D. Manuel Garcia, [place]

## 2020-12-13 NOTE — Patient Instructions (Addendum)
Medication Instructions:  No changes  If you need a refill on your cardiac medications before your next appointment, please call your pharmacy.   Lab work: No new labs needed  Testing/Procedures: No new testing needed  Follow-Up: At [facility], you and your health needs are our priority.  As part of our continuing mission to provide you with exceptional heart care, we have created designated Provider Care Teams.  These Care Teams include your primary Cardiologist (physician) and Advanced Practice Providers (APPs -  Physician Assistants and Nurse Practitioners) who all work together to provide you with the care you need, when you need it.  You will need a follow up appointment as needed  Providers on your designated Care Team:   Christopher Berge, NP Ryan Dunn, PA-C Cadence Furth, PA-C  COVID-19 Vaccine Information can be found at: [email or web address] For questions related to vaccine distribution or appointments, please email [email or web address] or call [phone].    

## 2020-12-15 DIAGNOSIS — G4733 Obstructive sleep apnea (adult) (pediatric): Secondary | ICD-10-CM | POA: Diagnosis not present

## 2020-12-20 NOTE — Addendum Note (Signed)
Addended by: Britt Bottom on: 12/20/2020 12:45 PM   Modules accepted: Orders

## 2020-12-28 DIAGNOSIS — Z96651 Presence of right artificial knee joint: Secondary | ICD-10-CM | POA: Diagnosis not present

## 2020-12-28 DIAGNOSIS — M1712 Unilateral primary osteoarthritis, left knee: Secondary | ICD-10-CM | POA: Diagnosis not present

## 2020-12-28 DIAGNOSIS — K219 Gastro-esophageal reflux disease without esophagitis: Secondary | ICD-10-CM | POA: Diagnosis not present

## 2020-12-28 DIAGNOSIS — G4730 Sleep apnea, unspecified: Secondary | ICD-10-CM | POA: Diagnosis not present

## 2020-12-28 DIAGNOSIS — Z886 Allergy status to analgesic agent status: Secondary | ICD-10-CM | POA: Diagnosis not present

## 2020-12-28 DIAGNOSIS — M25761 Osteophyte, right knee: Secondary | ICD-10-CM | POA: Diagnosis not present

## 2020-12-28 DIAGNOSIS — M1711 Unilateral primary osteoarthritis, right knee: Secondary | ICD-10-CM | POA: Diagnosis not present

## 2020-12-28 DIAGNOSIS — G8918 Other acute postprocedural pain: Secondary | ICD-10-CM | POA: Diagnosis not present

## 2020-12-28 DIAGNOSIS — E119 Type 2 diabetes mellitus without complications: Secondary | ICD-10-CM | POA: Diagnosis not present

## 2020-12-28 DIAGNOSIS — R569 Unspecified convulsions: Secondary | ICD-10-CM | POA: Diagnosis not present

## 2020-12-29 DIAGNOSIS — M25761 Osteophyte, right knee: Secondary | ICD-10-CM | POA: Diagnosis not present

## 2020-12-29 DIAGNOSIS — Z886 Allergy status to analgesic agent status: Secondary | ICD-10-CM | POA: Diagnosis not present

## 2020-12-29 DIAGNOSIS — E119 Type 2 diabetes mellitus without complications: Secondary | ICD-10-CM | POA: Diagnosis not present

## 2020-12-29 DIAGNOSIS — M1711 Unilateral primary osteoarthritis, right knee: Secondary | ICD-10-CM | POA: Diagnosis not present

## 2020-12-29 DIAGNOSIS — K219 Gastro-esophageal reflux disease without esophagitis: Secondary | ICD-10-CM | POA: Diagnosis not present

## 2020-12-29 DIAGNOSIS — G4730 Sleep apnea, unspecified: Secondary | ICD-10-CM | POA: Diagnosis not present

## 2020-12-29 DIAGNOSIS — R569 Unspecified convulsions: Secondary | ICD-10-CM | POA: Diagnosis not present

## 2020-12-31 DIAGNOSIS — G4733 Obstructive sleep apnea (adult) (pediatric): Secondary | ICD-10-CM | POA: Diagnosis not present

## 2020-12-31 DIAGNOSIS — Z7982 Long term (current) use of aspirin: Secondary | ICD-10-CM | POA: Diagnosis not present

## 2020-12-31 DIAGNOSIS — J309 Allergic rhinitis, unspecified: Secondary | ICD-10-CM | POA: Diagnosis not present

## 2020-12-31 DIAGNOSIS — E119 Type 2 diabetes mellitus without complications: Secondary | ICD-10-CM | POA: Diagnosis not present

## 2020-12-31 DIAGNOSIS — E038 Other specified hypothyroidism: Secondary | ICD-10-CM | POA: Diagnosis not present

## 2020-12-31 DIAGNOSIS — Z471 Aftercare following joint replacement surgery: Secondary | ICD-10-CM | POA: Diagnosis not present

## 2020-12-31 DIAGNOSIS — Z96651 Presence of right artificial knee joint: Secondary | ICD-10-CM | POA: Diagnosis not present

## 2021-01-02 DIAGNOSIS — E119 Type 2 diabetes mellitus without complications: Secondary | ICD-10-CM | POA: Diagnosis not present

## 2021-01-02 DIAGNOSIS — Z471 Aftercare following joint replacement surgery: Secondary | ICD-10-CM | POA: Diagnosis not present

## 2021-01-02 DIAGNOSIS — Z7982 Long term (current) use of aspirin: Secondary | ICD-10-CM | POA: Diagnosis not present

## 2021-01-02 DIAGNOSIS — Z96651 Presence of right artificial knee joint: Secondary | ICD-10-CM | POA: Diagnosis not present

## 2021-01-02 DIAGNOSIS — G4733 Obstructive sleep apnea (adult) (pediatric): Secondary | ICD-10-CM | POA: Diagnosis not present

## 2021-01-02 DIAGNOSIS — J309 Allergic rhinitis, unspecified: Secondary | ICD-10-CM | POA: Diagnosis not present

## 2021-01-02 DIAGNOSIS — E038 Other specified hypothyroidism: Secondary | ICD-10-CM | POA: Diagnosis not present

## 2021-01-03 DIAGNOSIS — Z471 Aftercare following joint replacement surgery: Secondary | ICD-10-CM | POA: Diagnosis not present

## 2021-01-03 DIAGNOSIS — J309 Allergic rhinitis, unspecified: Secondary | ICD-10-CM | POA: Diagnosis not present

## 2021-01-03 DIAGNOSIS — Z96651 Presence of right artificial knee joint: Secondary | ICD-10-CM | POA: Diagnosis not present

## 2021-01-03 DIAGNOSIS — Z7982 Long term (current) use of aspirin: Secondary | ICD-10-CM | POA: Diagnosis not present

## 2021-01-03 DIAGNOSIS — G4733 Obstructive sleep apnea (adult) (pediatric): Secondary | ICD-10-CM | POA: Diagnosis not present

## 2021-01-03 DIAGNOSIS — E038 Other specified hypothyroidism: Secondary | ICD-10-CM | POA: Diagnosis not present

## 2021-01-03 DIAGNOSIS — E119 Type 2 diabetes mellitus without complications: Secondary | ICD-10-CM | POA: Diagnosis not present

## 2021-01-05 DIAGNOSIS — G4733 Obstructive sleep apnea (adult) (pediatric): Secondary | ICD-10-CM | POA: Diagnosis not present

## 2021-01-05 DIAGNOSIS — Z471 Aftercare following joint replacement surgery: Secondary | ICD-10-CM | POA: Diagnosis not present

## 2021-01-05 DIAGNOSIS — E038 Other specified hypothyroidism: Secondary | ICD-10-CM | POA: Diagnosis not present

## 2021-01-05 DIAGNOSIS — Z7982 Long term (current) use of aspirin: Secondary | ICD-10-CM | POA: Diagnosis not present

## 2021-01-05 DIAGNOSIS — E119 Type 2 diabetes mellitus without complications: Secondary | ICD-10-CM | POA: Diagnosis not present

## 2021-01-05 DIAGNOSIS — J309 Allergic rhinitis, unspecified: Secondary | ICD-10-CM | POA: Diagnosis not present

## 2021-01-05 DIAGNOSIS — Z96651 Presence of right artificial knee joint: Secondary | ICD-10-CM | POA: Diagnosis not present

## 2021-01-08 DIAGNOSIS — Z471 Aftercare following joint replacement surgery: Secondary | ICD-10-CM | POA: Diagnosis not present

## 2021-01-08 DIAGNOSIS — E038 Other specified hypothyroidism: Secondary | ICD-10-CM | POA: Diagnosis not present

## 2021-01-08 DIAGNOSIS — G4733 Obstructive sleep apnea (adult) (pediatric): Secondary | ICD-10-CM | POA: Diagnosis not present

## 2021-01-08 DIAGNOSIS — Z96651 Presence of right artificial knee joint: Secondary | ICD-10-CM | POA: Diagnosis not present

## 2021-01-08 DIAGNOSIS — J309 Allergic rhinitis, unspecified: Secondary | ICD-10-CM | POA: Diagnosis not present

## 2021-01-08 DIAGNOSIS — Z7982 Long term (current) use of aspirin: Secondary | ICD-10-CM | POA: Diagnosis not present

## 2021-01-08 DIAGNOSIS — E119 Type 2 diabetes mellitus without complications: Secondary | ICD-10-CM | POA: Diagnosis not present

## 2021-01-10 DIAGNOSIS — J309 Allergic rhinitis, unspecified: Secondary | ICD-10-CM | POA: Diagnosis not present

## 2021-01-10 DIAGNOSIS — Z96651 Presence of right artificial knee joint: Secondary | ICD-10-CM | POA: Diagnosis not present

## 2021-01-10 DIAGNOSIS — E038 Other specified hypothyroidism: Secondary | ICD-10-CM | POA: Diagnosis not present

## 2021-01-10 DIAGNOSIS — Z471 Aftercare following joint replacement surgery: Secondary | ICD-10-CM | POA: Diagnosis not present

## 2021-01-10 DIAGNOSIS — E119 Type 2 diabetes mellitus without complications: Secondary | ICD-10-CM | POA: Diagnosis not present

## 2021-01-10 DIAGNOSIS — Z7982 Long term (current) use of aspirin: Secondary | ICD-10-CM | POA: Diagnosis not present

## 2021-01-10 DIAGNOSIS — G4733 Obstructive sleep apnea (adult) (pediatric): Secondary | ICD-10-CM | POA: Diagnosis not present

## 2021-01-12 DIAGNOSIS — Z471 Aftercare following joint replacement surgery: Secondary | ICD-10-CM | POA: Diagnosis not present

## 2021-01-12 DIAGNOSIS — Z7982 Long term (current) use of aspirin: Secondary | ICD-10-CM | POA: Diagnosis not present

## 2021-01-12 DIAGNOSIS — J309 Allergic rhinitis, unspecified: Secondary | ICD-10-CM | POA: Diagnosis not present

## 2021-01-12 DIAGNOSIS — E038 Other specified hypothyroidism: Secondary | ICD-10-CM | POA: Diagnosis not present

## 2021-01-12 DIAGNOSIS — E119 Type 2 diabetes mellitus without complications: Secondary | ICD-10-CM | POA: Diagnosis not present

## 2021-01-12 DIAGNOSIS — G4733 Obstructive sleep apnea (adult) (pediatric): Secondary | ICD-10-CM | POA: Diagnosis not present

## 2021-01-12 DIAGNOSIS — Z96651 Presence of right artificial knee joint: Secondary | ICD-10-CM | POA: Diagnosis not present

## 2021-01-15 DIAGNOSIS — G4733 Obstructive sleep apnea (adult) (pediatric): Secondary | ICD-10-CM | POA: Diagnosis not present

## 2021-01-16 DIAGNOSIS — M25561 Pain in right knee: Secondary | ICD-10-CM | POA: Diagnosis not present

## 2021-01-16 DIAGNOSIS — R262 Difficulty in walking, not elsewhere classified: Secondary | ICD-10-CM | POA: Diagnosis not present

## 2021-01-16 DIAGNOSIS — R29898 Other symptoms and signs involving the musculoskeletal system: Secondary | ICD-10-CM | POA: Diagnosis not present

## 2021-01-16 DIAGNOSIS — Z471 Aftercare following joint replacement surgery: Secondary | ICD-10-CM | POA: Diagnosis not present

## 2021-01-17 DIAGNOSIS — R29898 Other symptoms and signs involving the musculoskeletal system: Secondary | ICD-10-CM | POA: Diagnosis not present

## 2021-01-17 DIAGNOSIS — Z471 Aftercare following joint replacement surgery: Secondary | ICD-10-CM | POA: Diagnosis not present

## 2021-01-17 DIAGNOSIS — M25561 Pain in right knee: Secondary | ICD-10-CM | POA: Diagnosis not present

## 2021-01-17 DIAGNOSIS — R262 Difficulty in walking, not elsewhere classified: Secondary | ICD-10-CM | POA: Diagnosis not present

## 2021-01-18 DIAGNOSIS — M25561 Pain in right knee: Secondary | ICD-10-CM | POA: Diagnosis not present

## 2021-01-18 DIAGNOSIS — R29898 Other symptoms and signs involving the musculoskeletal system: Secondary | ICD-10-CM | POA: Diagnosis not present

## 2021-01-18 DIAGNOSIS — Z471 Aftercare following joint replacement surgery: Secondary | ICD-10-CM | POA: Diagnosis not present

## 2021-01-18 DIAGNOSIS — R262 Difficulty in walking, not elsewhere classified: Secondary | ICD-10-CM | POA: Diagnosis not present

## 2021-01-20 DIAGNOSIS — Z471 Aftercare following joint replacement surgery: Secondary | ICD-10-CM | POA: Diagnosis not present

## 2021-01-20 DIAGNOSIS — R29898 Other symptoms and signs involving the musculoskeletal system: Secondary | ICD-10-CM | POA: Diagnosis not present

## 2021-01-20 DIAGNOSIS — R262 Difficulty in walking, not elsewhere classified: Secondary | ICD-10-CM | POA: Diagnosis not present

## 2021-01-20 DIAGNOSIS — M25561 Pain in right knee: Secondary | ICD-10-CM | POA: Diagnosis not present

## 2021-01-22 DIAGNOSIS — M25561 Pain in right knee: Secondary | ICD-10-CM | POA: Diagnosis not present

## 2021-01-22 DIAGNOSIS — Z471 Aftercare following joint replacement surgery: Secondary | ICD-10-CM | POA: Diagnosis not present

## 2021-01-22 DIAGNOSIS — R262 Difficulty in walking, not elsewhere classified: Secondary | ICD-10-CM | POA: Diagnosis not present

## 2021-01-22 DIAGNOSIS — R29898 Other symptoms and signs involving the musculoskeletal system: Secondary | ICD-10-CM | POA: Diagnosis not present

## 2021-01-23 ENCOUNTER — Other Ambulatory Visit: Payer: Self-pay | Admitting: Podiatry

## 2021-01-24 DIAGNOSIS — Z471 Aftercare following joint replacement surgery: Secondary | ICD-10-CM | POA: Diagnosis not present

## 2021-01-24 DIAGNOSIS — M25561 Pain in right knee: Secondary | ICD-10-CM | POA: Diagnosis not present

## 2021-01-24 DIAGNOSIS — M6281 Muscle weakness (generalized): Secondary | ICD-10-CM | POA: Diagnosis not present

## 2021-01-24 DIAGNOSIS — M25661 Stiffness of right knee, not elsewhere classified: Secondary | ICD-10-CM | POA: Diagnosis not present

## 2021-02-01 DIAGNOSIS — Z471 Aftercare following joint replacement surgery: Secondary | ICD-10-CM | POA: Diagnosis not present

## 2021-02-01 DIAGNOSIS — M25561 Pain in right knee: Secondary | ICD-10-CM | POA: Diagnosis not present

## 2021-02-01 DIAGNOSIS — M6281 Muscle weakness (generalized): Secondary | ICD-10-CM | POA: Diagnosis not present

## 2021-02-01 DIAGNOSIS — M25661 Stiffness of right knee, not elsewhere classified: Secondary | ICD-10-CM | POA: Diagnosis not present

## 2021-02-05 DIAGNOSIS — M25661 Stiffness of right knee, not elsewhere classified: Secondary | ICD-10-CM | POA: Diagnosis not present

## 2021-02-05 DIAGNOSIS — M25561 Pain in right knee: Secondary | ICD-10-CM | POA: Diagnosis not present

## 2021-02-05 DIAGNOSIS — M545 Low back pain, unspecified: Secondary | ICD-10-CM | POA: Diagnosis not present

## 2021-02-05 DIAGNOSIS — Z471 Aftercare following joint replacement surgery: Secondary | ICD-10-CM | POA: Diagnosis not present

## 2021-02-07 DIAGNOSIS — M6281 Muscle weakness (generalized): Secondary | ICD-10-CM | POA: Diagnosis not present

## 2021-02-07 DIAGNOSIS — R262 Difficulty in walking, not elsewhere classified: Secondary | ICD-10-CM | POA: Diagnosis not present

## 2021-02-07 DIAGNOSIS — M25561 Pain in right knee: Secondary | ICD-10-CM | POA: Diagnosis not present

## 2021-02-07 DIAGNOSIS — M25661 Stiffness of right knee, not elsewhere classified: Secondary | ICD-10-CM | POA: Diagnosis not present

## 2021-02-12 DIAGNOSIS — Z471 Aftercare following joint replacement surgery: Secondary | ICD-10-CM | POA: Diagnosis not present

## 2021-02-12 DIAGNOSIS — M25561 Pain in right knee: Secondary | ICD-10-CM | POA: Diagnosis not present

## 2021-02-12 DIAGNOSIS — M6281 Muscle weakness (generalized): Secondary | ICD-10-CM | POA: Diagnosis not present

## 2021-02-12 DIAGNOSIS — M25661 Stiffness of right knee, not elsewhere classified: Secondary | ICD-10-CM | POA: Diagnosis not present

## 2021-02-15 DIAGNOSIS — M25661 Stiffness of right knee, not elsewhere classified: Secondary | ICD-10-CM | POA: Diagnosis not present

## 2021-02-15 DIAGNOSIS — G4733 Obstructive sleep apnea (adult) (pediatric): Secondary | ICD-10-CM | POA: Diagnosis not present

## 2021-02-15 DIAGNOSIS — M6281 Muscle weakness (generalized): Secondary | ICD-10-CM | POA: Diagnosis not present

## 2021-02-15 DIAGNOSIS — Z471 Aftercare following joint replacement surgery: Secondary | ICD-10-CM | POA: Diagnosis not present

## 2021-02-15 DIAGNOSIS — M25561 Pain in right knee: Secondary | ICD-10-CM | POA: Diagnosis not present

## 2021-02-21 DIAGNOSIS — Z471 Aftercare following joint replacement surgery: Secondary | ICD-10-CM | POA: Diagnosis not present

## 2021-02-21 DIAGNOSIS — M25661 Stiffness of right knee, not elsewhere classified: Secondary | ICD-10-CM | POA: Diagnosis not present

## 2021-02-21 DIAGNOSIS — M6281 Muscle weakness (generalized): Secondary | ICD-10-CM | POA: Diagnosis not present

## 2021-02-21 DIAGNOSIS — M25561 Pain in right knee: Secondary | ICD-10-CM | POA: Diagnosis not present

## 2021-02-23 DIAGNOSIS — Z471 Aftercare following joint replacement surgery: Secondary | ICD-10-CM | POA: Diagnosis not present

## 2021-02-23 DIAGNOSIS — Z96651 Presence of right artificial knee joint: Secondary | ICD-10-CM | POA: Diagnosis not present

## 2021-02-26 DIAGNOSIS — M25661 Stiffness of right knee, not elsewhere classified: Secondary | ICD-10-CM | POA: Diagnosis not present

## 2021-02-26 DIAGNOSIS — M25561 Pain in right knee: Secondary | ICD-10-CM | POA: Diagnosis not present

## 2021-02-26 DIAGNOSIS — M6281 Muscle weakness (generalized): Secondary | ICD-10-CM | POA: Diagnosis not present

## 2021-02-26 DIAGNOSIS — Z471 Aftercare following joint replacement surgery: Secondary | ICD-10-CM | POA: Diagnosis not present

## 2021-03-01 DIAGNOSIS — M545 Low back pain, unspecified: Secondary | ICD-10-CM | POA: Diagnosis not present

## 2021-03-01 DIAGNOSIS — Z96659 Presence of unspecified artificial knee joint: Secondary | ICD-10-CM | POA: Diagnosis not present

## 2021-03-01 DIAGNOSIS — Z23 Encounter for immunization: Secondary | ICD-10-CM | POA: Diagnosis not present

## 2021-03-06 DIAGNOSIS — Z471 Aftercare following joint replacement surgery: Secondary | ICD-10-CM | POA: Diagnosis not present

## 2021-03-06 DIAGNOSIS — M6281 Muscle weakness (generalized): Secondary | ICD-10-CM | POA: Diagnosis not present

## 2021-03-06 DIAGNOSIS — M25561 Pain in right knee: Secondary | ICD-10-CM | POA: Diagnosis not present

## 2021-03-06 DIAGNOSIS — M25661 Stiffness of right knee, not elsewhere classified: Secondary | ICD-10-CM | POA: Diagnosis not present

## 2021-03-12 DIAGNOSIS — M6281 Muscle weakness (generalized): Secondary | ICD-10-CM | POA: Diagnosis not present

## 2021-03-12 DIAGNOSIS — M25561 Pain in right knee: Secondary | ICD-10-CM | POA: Diagnosis not present

## 2021-03-12 DIAGNOSIS — Z471 Aftercare following joint replacement surgery: Secondary | ICD-10-CM | POA: Diagnosis not present

## 2021-03-12 DIAGNOSIS — M25661 Stiffness of right knee, not elsewhere classified: Secondary | ICD-10-CM | POA: Diagnosis not present

## 2021-03-13 ENCOUNTER — Other Ambulatory Visit: Payer: Self-pay

## 2021-03-13 ENCOUNTER — Ambulatory Visit: Payer: BC Managed Care – PPO | Admitting: Podiatry

## 2021-03-13 DIAGNOSIS — Z79899 Other long term (current) drug therapy: Secondary | ICD-10-CM | POA: Diagnosis not present

## 2021-03-13 DIAGNOSIS — B351 Tinea unguium: Secondary | ICD-10-CM

## 2021-03-13 NOTE — Progress Notes (Signed)
Subjective:  Patient ID: Madison Armstrong, female    DOB: [birth year],  MRN: [id number]  Chief Complaint  Patient presents with   Nail Problem    Nail fungus follow up     56 y.o. female presents with the above complaint.  Patient presents with a follow-up of onychomycosis of toenails x10.  She states that the Lamisil therapy helped however she was not able to follow back up because of knee replacement that she underwent.  She states that Lamisil did help and it did improve her nails.  She would like to know she can do another round.   Review of Systems: Negative except as noted in the HPI. Denies N/V/F/Ch.  Past Medical History:  Diagnosis Date   Chicken pox    Diabetes mellitus without complication (HCC)     Current Outpatient Medications:    albuterol (VENTOLIN HFA) 108 (90 Base) MCG/ACT inhaler, Inhale 1-2 puffs into the lungs every 6 (six) hours as needed for wheezing or shortness of breath., Disp: 18 g, Rfl: 1   benzonatate (TESSALON) 100 MG capsule, Take 1 capsule (100 mg total) by mouth every 8 (eight) hours. (Patient not taking: Reported on 12/13/2020), Disp: 30 capsule, Rfl: 0   CELEBREX 200 MG capsule, Take 200 mg by mouth 2 (two) times daily., Disp: , Rfl:    cetirizine (ZYRTEC ALLERGY) 10 MG tablet, Take 1 tablet (10 mg total) by mouth daily., Disp: 30 tablet, Rfl: 0   Cholecalciferol 125 MCG (5000 UT) TABS, Take 5,000 Units by mouth daily. (Patient not taking: Reported on 12/13/2020), Disp: , Rfl:    clotrimazole-betamethasone (LOTRISONE) cream, Apply 1 application topically daily. (Patient not taking: Reported on 12/13/2020), Disp: 30 g, Rfl: 2   cyclobenzaprine (FLEXERIL) 10 MG tablet, Take 10 mg by mouth at bedtime. (Patient not taking: Reported on 12/13/2020), Disp: , Rfl:    cyclobenzaprine (FLEXERIL) 5 MG tablet, Take 1 tablet (5 mg total) by mouth 3 (three) times daily as needed for muscle spasms. (Patient not taking: Reported on 02/19/2019), Disp: 30 tablet, Rfl: 0    estradiol (VIVELLE-DOT) 0.025 MG/24HR, Place 1 patch onto the skin 2 (two) times a week., Disp: , Rfl:    fluticasone (FLONASE) 50 MCG/ACT nasal spray, Place 1 spray into both nostrils daily for 14 days., Disp: 16 g, Rfl: 0   levETIRAcetam (KEPPRA) 500 MG tablet, Take 1 tablet (500 mg total) by mouth 2 (two) times daily. (Patient not taking: Reported on 12/13/2020), Disp: 60 tablet, Rfl: 0   lidocaine (LIDODERM) 5 %, Place 1 patch onto the skin every 12 (twelve) hours. (Patient not taking: Reported on 12/13/2020), Disp: , Rfl:    Magnesium Oxide (MAG-OXIDE) 200 MG TABS, Take 1 tablet (200 mg total) by mouth daily. (Patient not taking: Reported on 12/13/2020), Disp: 5 tablet, Rfl: 0   meclizine (ANTIVERT) 25 MG tablet, Take 1 tablet (25 mg total) by mouth 3 (three) times daily as needed for dizziness., Disp: 15 tablet, Rfl: 0   ondansetron (ZOFRAN ODT) 4 MG disintegrating tablet, Take 1 tablet (4 mg total) by mouth every 8 (eight) hours as needed for nausea or vomiting. (Patient not taking: Reported on 02/19/2019), Disp: 20 tablet, Rfl: 0   potassium chloride SA (KLOR-CON) 20 MEQ tablet, Take 1 tablet (20 mEq total) by mouth 2 (two) times daily for 5 days. (Patient not taking: Reported on 12/13/2020), Disp: 10 tablet, Rfl: 0   predniSONE (STERAPRED UNI-PAK 21 TAB) 10 MG (21) TBPK tablet, Take by mouth daily.  Take 6 tabs by mouth daily  for 1 days, then 5 tabs for 1 days, then 4 tabs for 1 days, then 3 tabs for 1 days, 2 tabs for 1 days, then 1 tab by mouth daily for 1 days (Patient not taking: Reported on 12/13/2020), Disp: 21 tablet, Rfl: 0   terbinafine (LAMISIL) 250 MG tablet, Take 1 tablet (250 mg total) by mouth daily. (Patient not taking: Reported on 12/13/2020), Disp: 90 tablet, Rfl: 0   ULTRAM 50 MG tablet, Take 50 mg by mouth every 8 (eight) hours. (Patient not taking: Reported on 12/13/2020), Disp: , Rfl:   Social History   Tobacco Use  Smoking Status Never  Smokeless Tobacco Never     Allergies  Allergen Reactions   Tramadol    Objective:  There were no vitals filed for this visit. There is no height or weight on file to calculate BMI. Constitutional Well developed. Well nourished.  Vascular Dorsalis pedis pulses palpable bilaterally. Posterior tibial pulses palpable bilaterally. Capillary refill normal to all digits.  No cyanosis or clubbing noted. Pedal hair growth normal.  Neurologic Normal speech. Oriented to person, place, and time. Epicritic sensation to light touch grossly present bilaterally.  Dermatologic Improving thickened elongated dystrophic mycotic discolored nail noted x10.  Mild pain on palpation.  Plantar foot skin epidermal lysis noted with subjective complaint of itching. No open wounds. No skin lesions.  Orthopedic: Gait examination shows pes planovalgus foot structure with calcaneovalgus to many toe signs unable to recruit the arch with dorsiflexion of the hallux.  Unable to perform single and double heel raise.   Radiographs: 3 views of skeletally mature adult bilateral foot: There is decreasing calcaneal inclination angle increasing talar declination angle midfoot arthritis noted.  Mild elevatus present.  No other bony abnormalities identified. Assessment:   1. Encounter for long-term current use of medication   2. Nail fungus    Plan:  Patient was evaluated and treated and all questions answered.  Bilateral onychomycosis x10~second round -Educated the patient on the etiology of onychomycosis and various treatment options associated with improving the fungal load.  I explained to the patient that there is 3 treatment options available to treat the onychomycosis including topical, p.o., laser treatment.  Patient elected to second round undergo p.o. options with Lamisil/terbinafine therapy.  In order for me to start the medication therapy, I explained to the patient the importance of evaluating another the liver and obtaining the liver  function test.  Once the liver function test comes back normal I will start her on second on 9-month course of Lamisil therapy.  Patient understood all risk and would like to proceed with Lamisil therapy.  I have asked the patient to immediately stop the Lamisil therapy if she has any reactions to it and call the office or go to the emergency room right away.  Patient states understanding  Bilateral athlete's foot -I explained to the patient the etiology of athlete's foot and various treatment options were discussed.  Given that she has failed all the over-the-counter treatment options I believe she would benefit from prescription lotion.  She was given Lotrisone cream.  I have asked her to apply twice a day.  She states understanding  Pes planovalgus -I explained to patient the etiology of pes planovalgus and worse treatment options were discussed.  Given that she is having arch and heel related pain in the setting of fixed pes planovalgus nonflexible I believe she would benefit from custom-made orthotics to help control  the hindfoot motion support the arch of the foot.  She states understanding -She was casted for orthotics  No follow-ups on file.

## 2021-03-14 DIAGNOSIS — M25561 Pain in right knee: Secondary | ICD-10-CM | POA: Diagnosis not present

## 2021-03-14 DIAGNOSIS — M25661 Stiffness of right knee, not elsewhere classified: Secondary | ICD-10-CM | POA: Diagnosis not present

## 2021-03-14 DIAGNOSIS — Z471 Aftercare following joint replacement surgery: Secondary | ICD-10-CM | POA: Diagnosis not present

## 2021-03-14 DIAGNOSIS — M6281 Muscle weakness (generalized): Secondary | ICD-10-CM | POA: Diagnosis not present

## 2021-03-14 DIAGNOSIS — R262 Difficulty in walking, not elsewhere classified: Secondary | ICD-10-CM | POA: Diagnosis not present

## 2021-03-14 LAB — HEPATIC FUNCTION PANEL
ALT: 16 IU/L (ref 0–32)
AST: 16 IU/L (ref 0–40)
Albumin: 4.1 g/dL (ref 3.8–4.9)
Alkaline Phosphatase: 101 IU/L (ref 44–121)
Bilirubin Total: <0.2 mg/dL (ref 0.0–1.2)
Bilirubin, Direct: <0.1 mg/dL (ref 0.00–0.40)
Total Protein: 7.8 g/dL (ref 6.0–8.5)

## 2021-03-14 MED ORDER — TERBINAFINE HCL 250 MG PO TABS: 250.0000 mg | ORAL_TABLET | Freq: Every day | ORAL | 0 refills | Status: DC

## 2021-03-14 NOTE — Addendum Note (Signed)
Addended by: Boneta Lucks on: 03/14/2021 07:54 AM   Modules accepted: Orders

## 2021-03-15 DIAGNOSIS — G4733 Obstructive sleep apnea (adult) (pediatric): Secondary | ICD-10-CM | POA: Diagnosis not present

## 2021-03-27 DIAGNOSIS — M1712 Unilateral primary osteoarthritis, left knee: Secondary | ICD-10-CM | POA: Diagnosis not present

## 2021-03-27 DIAGNOSIS — Z471 Aftercare following joint replacement surgery: Secondary | ICD-10-CM | POA: Diagnosis not present

## 2021-03-27 DIAGNOSIS — E669 Obesity, unspecified: Secondary | ICD-10-CM | POA: Diagnosis not present

## 2021-03-27 DIAGNOSIS — Z96651 Presence of right artificial knee joint: Secondary | ICD-10-CM | POA: Diagnosis not present

## 2021-03-27 DIAGNOSIS — Z96659 Presence of unspecified artificial knee joint: Secondary | ICD-10-CM | POA: Diagnosis not present

## 2021-03-27 DIAGNOSIS — M545 Low back pain, unspecified: Secondary | ICD-10-CM | POA: Diagnosis not present

## 2021-04-12 DIAGNOSIS — Z1272 Encounter for screening for malignant neoplasm of vagina: Secondary | ICD-10-CM | POA: Diagnosis not present

## 2021-04-12 DIAGNOSIS — R61 Generalized hyperhidrosis: Secondary | ICD-10-CM | POA: Diagnosis not present

## 2021-04-12 DIAGNOSIS — N951 Menopausal and female climacteric states: Secondary | ICD-10-CM | POA: Diagnosis not present

## 2021-04-12 DIAGNOSIS — Z1239 Encounter for other screening for malignant neoplasm of breast: Secondary | ICD-10-CM | POA: Diagnosis not present

## 2021-04-12 DIAGNOSIS — Z01419 Encounter for gynecological examination (general) (routine) without abnormal findings: Secondary | ICD-10-CM | POA: Diagnosis not present

## 2021-04-12 DIAGNOSIS — Z1231 Encounter for screening mammogram for malignant neoplasm of breast: Secondary | ICD-10-CM | POA: Diagnosis not present

## 2021-04-15 DIAGNOSIS — G4733 Obstructive sleep apnea (adult) (pediatric): Secondary | ICD-10-CM | POA: Diagnosis not present

## 2021-04-26 DIAGNOSIS — E785 Hyperlipidemia, unspecified: Secondary | ICD-10-CM | POA: Diagnosis not present

## 2021-04-26 DIAGNOSIS — Z6838 Body mass index (BMI) 38.0-38.9, adult: Secondary | ICD-10-CM | POA: Diagnosis not present

## 2021-04-26 DIAGNOSIS — G4733 Obstructive sleep apnea (adult) (pediatric): Secondary | ICD-10-CM | POA: Diagnosis not present

## 2021-05-11 DIAGNOSIS — M1712 Unilateral primary osteoarthritis, left knee: Secondary | ICD-10-CM | POA: Diagnosis not present

## 2021-05-15 DIAGNOSIS — R7303 Prediabetes: Secondary | ICD-10-CM | POA: Diagnosis not present

## 2021-05-15 DIAGNOSIS — G4733 Obstructive sleep apnea (adult) (pediatric): Secondary | ICD-10-CM | POA: Diagnosis not present

## 2021-05-15 DIAGNOSIS — E785 Hyperlipidemia, unspecified: Secondary | ICD-10-CM | POA: Diagnosis not present

## 2021-05-15 DIAGNOSIS — E559 Vitamin D deficiency, unspecified: Secondary | ICD-10-CM | POA: Diagnosis not present

## 2021-05-21 DIAGNOSIS — K5900 Constipation, unspecified: Secondary | ICD-10-CM | POA: Diagnosis not present

## 2021-05-21 DIAGNOSIS — R000 Tachycardia, unspecified: Secondary | ICD-10-CM | POA: Diagnosis not present

## 2021-05-21 DIAGNOSIS — Z6838 Body mass index (BMI) 38.0-38.9, adult: Secondary | ICD-10-CM | POA: Diagnosis not present

## 2021-06-15 DIAGNOSIS — G4733 Obstructive sleep apnea (adult) (pediatric): Secondary | ICD-10-CM | POA: Diagnosis not present

## 2021-06-19 DIAGNOSIS — Z23 Encounter for immunization: Secondary | ICD-10-CM | POA: Diagnosis not present

## 2021-06-19 DIAGNOSIS — M545 Low back pain, unspecified: Secondary | ICD-10-CM | POA: Diagnosis not present

## 2021-06-19 DIAGNOSIS — Z1239 Encounter for other screening for malignant neoplasm of breast: Secondary | ICD-10-CM | POA: Diagnosis not present

## 2021-06-19 DIAGNOSIS — I739 Peripheral vascular disease, unspecified: Secondary | ICD-10-CM | POA: Diagnosis not present

## 2021-06-19 DIAGNOSIS — K5900 Constipation, unspecified: Secondary | ICD-10-CM | POA: Diagnosis not present

## 2021-06-19 DIAGNOSIS — M170 Bilateral primary osteoarthritis of knee: Secondary | ICD-10-CM | POA: Diagnosis not present

## 2021-06-19 DIAGNOSIS — Z0001 Encounter for general adult medical examination with abnormal findings: Secondary | ICD-10-CM | POA: Diagnosis not present

## 2021-06-19 DIAGNOSIS — Z124 Encounter for screening for malignant neoplasm of cervix: Secondary | ICD-10-CM | POA: Diagnosis not present

## 2021-07-04 DIAGNOSIS — N951 Menopausal and female climacteric states: Secondary | ICD-10-CM | POA: Diagnosis not present

## 2021-07-04 DIAGNOSIS — R6882 Decreased libido: Secondary | ICD-10-CM | POA: Diagnosis not present

## 2021-07-12 ENCOUNTER — Ambulatory Visit: Payer: BC Managed Care – PPO | Admitting: Podiatry

## 2021-07-12 DIAGNOSIS — R110 Nausea: Secondary | ICD-10-CM | POA: Diagnosis not present

## 2021-07-12 DIAGNOSIS — K5900 Constipation, unspecified: Secondary | ICD-10-CM | POA: Diagnosis not present

## 2021-07-12 DIAGNOSIS — Z6838 Body mass index (BMI) 38.0-38.9, adult: Secondary | ICD-10-CM | POA: Diagnosis not present

## 2021-07-15 DIAGNOSIS — G4733 Obstructive sleep apnea (adult) (pediatric): Secondary | ICD-10-CM | POA: Diagnosis not present

## 2021-08-06 DIAGNOSIS — K5900 Constipation, unspecified: Secondary | ICD-10-CM | POA: Diagnosis not present

## 2021-08-06 DIAGNOSIS — R110 Nausea: Secondary | ICD-10-CM | POA: Diagnosis not present

## 2021-08-06 DIAGNOSIS — Z6838 Body mass index (BMI) 38.0-38.9, adult: Secondary | ICD-10-CM | POA: Diagnosis not present

## 2021-08-15 DIAGNOSIS — G4733 Obstructive sleep apnea (adult) (pediatric): Secondary | ICD-10-CM | POA: Diagnosis not present

## 2021-08-21 DIAGNOSIS — M25561 Pain in right knee: Secondary | ICD-10-CM | POA: Diagnosis not present

## 2021-08-21 DIAGNOSIS — Z96651 Presence of right artificial knee joint: Secondary | ICD-10-CM | POA: Diagnosis not present

## 2021-08-23 DIAGNOSIS — G4733 Obstructive sleep apnea (adult) (pediatric): Secondary | ICD-10-CM | POA: Diagnosis not present

## 2021-08-30 DIAGNOSIS — R110 Nausea: Secondary | ICD-10-CM | POA: Diagnosis not present

## 2021-08-30 DIAGNOSIS — K5903 Drug induced constipation: Secondary | ICD-10-CM | POA: Diagnosis not present

## 2021-08-30 DIAGNOSIS — Z6838 Body mass index (BMI) 38.0-38.9, adult: Secondary | ICD-10-CM | POA: Diagnosis not present

## 2021-09-15 DIAGNOSIS — G4733 Obstructive sleep apnea (adult) (pediatric): Secondary | ICD-10-CM | POA: Diagnosis not present

## 2021-10-17 DIAGNOSIS — M1712 Unilateral primary osteoarthritis, left knee: Secondary | ICD-10-CM | POA: Diagnosis not present

## 2021-11-12 ENCOUNTER — Other Ambulatory Visit: Payer: Self-pay | Admitting: Nurse Practitioner

## 2021-11-12 ENCOUNTER — Telehealth: Payer: BC Managed Care – PPO | Admitting: Nurse Practitioner

## 2021-11-12 DIAGNOSIS — H00011 Hordeolum externum right upper eyelid: Secondary | ICD-10-CM | POA: Diagnosis not present

## 2021-11-12 MED ORDER — NEOMYCIN-POLYMYXIN-HC 3.5-10000-1 OP SUSP: 2.0000 [drp] | OPHTHALMIC | 0 refills | Status: DC

## 2021-11-12 MED ORDER — BACITRACIN-POLYMYXIN B 500-10000 UNIT/GM OP OINT: 1.0000 | TOPICAL_OINTMENT | Freq: Four times a day (QID) | OPHTHALMIC | 0 refills | Status: AC

## 2021-11-12 NOTE — Progress Notes (Signed)
Changes Rx due to availability   Meds ordered this encounter  Medications   bacitracin-polymyxin b (POLYSPORIN) ophthalmic ointment    Sig: Place 1 Application into the right eye 4 (four) times daily for 5 days. Use warm compress prior to application of ointment    Dispense:  3.5 g    Refill:  0

## 2021-11-12 NOTE — Progress Notes (Signed)
  E-Visit for Stye   We are sorry that you are not feeling well. Here is how we plan to help!  Based on what you have shared with me it looks like you have a stye.  A stye is an inflammation of the eyelid.  It is often a red, painful lump near the edge of the eyelid that may look like a boil or a pimple.  A stye develops when an infection occurs at the base of an eyelash.   We have made appropriate suggestions for you based upon your presentation: Your symptoms may indicate an infection of the sclera.  The use of anti-inflammatory and antibiotic eye drops for a week will help resolve this condition.  I have sent in neomycin-polymyxin HC opthalmic suspension, two to three drops in the affected eye every 4 hours.  If your symptoms do not improve over the next two to three days you should be seen in your doctor's office.  HOME CARE:  Wash your hands often! Let the stye open on its own. Don't squeeze or open it. Don't rub your eyes. This can irritate your eyes and let in bacteria.  If you need to touch your eyes, wash your hands first. Don't wear eye makeup or contact lenses until the area has healed.  GET HELP RIGHT AWAY IF:  Your symptoms do not improve. You develop blurred or loss of vision. Your symptoms worsen (increased discharge, pain or redness).   Thank you for choosing an e-visit.  Your e-visit answers were reviewed by a board certified advanced clinical practitioner to complete your personal care plan. Depending upon the condition, your plan could have included both over the counter or prescription medications.  Please review your pharmacy choice. Make sure the pharmacy is open so you can pick up prescription now. If there is a problem, you may contact your provider through [employer] and have the prescription routed to another pharmacy.  Your safety is important to [place]. If you have drug allergies check your prescription carefully.   For the next 24 hours you can use  MyChart to ask questions about today's visit, request a non-urgent call back, or ask for a work or school excuse. You will get an email in the next two days asking about your experience. I hope that your e-visit has been valuable and will speed your recovery.   Meds ordered this encounter  Medications   neomycin-polymyxin-hydrocortisone (CORTISPORIN) 3.5-10000-1 ophthalmic suspension    Sig: Place 2 drops into the right eye every 4 (four) hours while awake for 5 days.    Dispense:  2.5 mL    Refill:  0    I spent approximately 5 minutes reviewing the patient's history, current symptoms and coordinating their plan of care today.

## 2021-11-15 DIAGNOSIS — R110 Nausea: Secondary | ICD-10-CM | POA: Diagnosis not present

## 2021-11-15 DIAGNOSIS — H00011 Hordeolum externum right upper eyelid: Secondary | ICD-10-CM | POA: Diagnosis not present

## 2021-11-15 DIAGNOSIS — K5903 Drug induced constipation: Secondary | ICD-10-CM | POA: Diagnosis not present

## 2021-11-21 ENCOUNTER — Ambulatory Visit
Admission: RE | Admit: 2021-11-21 | Discharge: 2021-11-21 | Disposition: A | Discharge status: Home / Self Care | Payer: BC Managed Care – PPO | Source: Ambulatory Visit | Attending: Family Medicine | Admitting: Family Medicine

## 2021-11-21 VITALS — BP 115/80 | HR 85 | Temp 98.9°F | Resp 20

## 2021-11-21 DIAGNOSIS — G4733 Obstructive sleep apnea (adult) (pediatric): Secondary | ICD-10-CM | POA: Diagnosis not present

## 2021-11-21 DIAGNOSIS — H00011 Hordeolum externum right upper eyelid: Secondary | ICD-10-CM | POA: Diagnosis not present

## 2021-11-21 MED ORDER — FLUCONAZOLE 150 MG PO TABS
150.0000 mg | ORAL_TABLET | ORAL | 0 refills | Status: DC
Start: 2021-11-21 — End: 2023-02-19

## 2021-11-21 MED ORDER — CEPHALEXIN 500 MG PO CAPS: 500.0000 mg | ORAL_CAPSULE | Freq: Two times a day (BID) | ORAL | 0 refills | Status: DC

## 2021-11-21 NOTE — ED Triage Notes (Signed)
Pt reports for a month now bump on right upper eyelid. Then one developed on her lower left eye lid. The right eye started being irritated then 2 days later a dump formed.   Pt reports doing nothing new to eye. No new creams, soaps, or detergent.      Dr prescribe antibiotic ointment for right eye tried warm compress but no relief. No puss but it drains.

## 2021-11-21 NOTE — ED Provider Notes (Signed)
[facility]    CSN: [id number] Arrival date & time: 11/21/21  1450      History   Chief Complaint Chief Complaint  Patient presents with   Eye Problem    Stye on eye.painful - Entered by patient    HPI Madison Armstrong is a 56 y.o. female.   Patient presenting today with 1 month history of a large stye to the right upper eyelid that has been draining off-and-on, worse the past few days.  She also had 1 pop up on the left eyelid but that 1 resolved with antibiotic ointment.  Has been applying antibiotic ointment and warm compresses to the right eye off-and-on with no relief.  Denies eye pain or vision changes, injury to the area.    Past Medical History:  Diagnosis Date   Chicken pox    Diabetes mellitus without complication ([place])     Patient Active Problem List   Diagnosis Date Noted   Osteoarthritis of both knees 09/27/2020   Seizure ([place]) 09/27/2020   Patellofemoral arthritis 07/10/2020   Arthralgia of both knees 07/06/2020   Chronic low back pain 07/06/2020   Sacroiliac joint pain 03/22/2019   Pseudoseizures 03/08/2019   Loose body in joint of lower leg 10/02/2018   Hyperlipidemia 04/28/2018   Prediabetes 04/28/2018   Occipital headache 09/23/2017   OSA (obstructive sleep apnea) 09/23/2017   Subclinical hypothyroidism 09/23/2017   Bilateral leg pain 07/03/2017   Annual physical exam 02/17/2017   Chronic pain of both shoulders 02/17/2017   Dyslipidemia 02/17/2017   Vertigo 02/17/2017   Closed fracture of phalanx of foot 11/25/2016   Cyst of ovary 11/25/2016   Chronic allergic rhinitis 02/15/2016   Class 2 obesity due to excess calories with body mass index (BMI) of 35.0 to 35.9 in adult 02/15/2016   Diet-controlled diabetes mellitus ([place]) 02/15/2016   Left-sided chest pain 02/07/2016   Multiple somatic complaints 09/13/2015   Type 2 diabetes mellitus without complication ([place]) 95/18/8416   Abnormal cervical Papanicolaou smear 01/15/2003     Past Surgical History:  Procedure Laterality Date   ABDOMINAL HYSTERECTOMY     COLONOSCOPY WITH PROPOFOL N/A 08/31/2014   Procedure: COLONOSCOPY WITH PROPOFOL;  Surgeon: Manya Silvas, MD;  Location: [facility];  Service: Endoscopy;  Laterality: N/A;   ESOPHAGOGASTRODUODENOSCOPY (EGD) WITH PROPOFOL N/A 08/31/2014   Procedure: ESOPHAGOGASTRODUODENOSCOPY (EGD) WITH PROPOFOL;  Surgeon: Manya Silvas, MD;  Location: [facility];  Service: Endoscopy;  Laterality: N/A;   nipple removal      OB History     Gravida  2   Para  2   Term  2   Preterm      AB      Living  2      SAB      IAB      Ectopic      Multiple      Live Births  2            Home Medications    Prior to Admission medications   Medication Sig Start Date End Date Taking? Authorizing Provider  cephALEXin (KEFLEX) 500 MG capsule Take 1 capsule (500 mg total) by mouth 2 (two) times daily. 11/21/21  Yes Volney American, PA-C  fluconazole (DIFLUCAN) 150 MG tablet Take 1 tablet (150 mg total) by mouth once a week. 11/21/21  Yes Volney American, PA-C  albuterol (VENTOLIN HFA) 108 (90 Base) MCG/ACT inhaler Inhale 1-2 puffs into the lungs every 6 (six)  hours as needed for wheezing or shortness of breath. 11/24/19   Avegno, Darrelyn Hillock, FNP  aspirin EC 325 MG tablet Take 1 tablet by mouth 2 (two) times daily. 02/14/21   [provider]  CELEBREX 200 MG capsule Take 200 mg by mouth 2 (two) times daily. 02/16/19   [provider]  cetirizine (ZYRTEC ALLERGY) 10 MG tablet Take 1 tablet (10 mg total) by mouth daily. 11/19/19   Avegno, Darrelyn Hillock, FNP  clotrimazole-betamethasone (LOTRISONE) cream Apply 1 application topically daily. Patient not taking: Reported on 12/13/2020 09/07/20   Felipa Furnace, DPM  estradiol (VIVELLE-DOT) 0.025 MG/24HR Place 1 patch onto the skin 2 (two) times a week. 01/25/19   [provider]  estradiol (VIVELLE-DOT) 0.025 MG/24HR Place onto  the skin. 04/05/19   [provider]  fluticasone (FLONASE) 50 MCG/ACT nasal spray Place 1 spray into both nostrils daily for 14 days. 11/19/19 12/13/20  Avegno, Darrelyn Hillock, FNP  lidocaine (LIDODERM) 5 % Place 1 patch onto the skin every 12 (twelve) hours. Patient not taking: Reported on 12/13/2020 01/27/19   [provider]  Liraglutide -Weight Management ([place]) 18 MG/3ML SOPN Waiting to start after surgery 12/04/20   [provider]  meclizine (ANTIVERT) 25 MG tablet Take 1 tablet (25 mg total) by mouth 3 (three) times daily as needed for dizziness. 02/19/19   Sherwood Gambler, MD  methocarbamol (ROBAXIN) 500 MG tablet methocarbamol 500 mg tablet  Take 1 tablet as needed by oral route at bedtime for 14 days.    [provider]  mometasone (NASONEX) 50 MCG/ACT nasal spray Administer 2 sprays in each nostril as needed. 04/18/19   [provider]  nadolol (CORGARD) 20 MG tablet Take by mouth. 04/12/19   [provider]  terbinafine (LAMISIL) 250 MG tablet Take 1 tablet (250 mg total) by mouth daily. Patient not taking: Reported on 12/13/2020 09/11/20   Felipa Furnace, DPM  terbinafine (LAMISIL) 250 MG tablet Take 1 tablet (250 mg total) by mouth daily. 03/14/21   Felipa Furnace, DPM    Family History Family History  Problem Relation Age of Onset   Stroke Mother    Hypertension Mother    Hypertension Father    Ovarian cancer Maternal Grandmother    Stroke Maternal Grandfather    Throat cancer Paternal Grandmother    Heart disease Paternal Grandfather    Heart disease Brother        Passed away at 24    Social History Social History   Tobacco Use   Smoking status: Never   Smokeless tobacco: Never  Substance Use Topics   Alcohol use: No   Drug use: No     Allergies   Tramadol   Review of Systems Review of Systems PER HPI  Physical Exam Triage Vital Signs ED Triage Vitals  Enc Vitals Group     BP 11/21/21 1516 115/80      Pulse Rate 11/21/21 1516 85     Resp 11/21/21 1516 20     Temp 11/21/21 1516 98.9 F (37.2 C)     Temp Source 11/21/21 1516 Oral     SpO2 11/21/21 1516 98 %     Weight --      Height --      Head Circumference --      Peak Flow --      Pain Score 11/21/21 1521 6     Pain Loc --      Pain Edu? --  Excl. in GC? --    No data found.  Updated Vital Signs BP 115/80 (BP Location: Right Arm)   Pulse 85   Temp 98.9 F (37.2 C) (Oral)   Resp 20   SpO2 98%   Visual Acuity Right Eye Distance:   Left Eye Distance:   Bilateral Distance:    Right Eye Near:   Left Eye Near:    Bilateral Near:     Physical Exam Vitals and nursing note reviewed.  Constitutional:      Appearance: Normal appearance. She is not ill-appearing.  HENT:     Head: Atraumatic.     Mouth/Throat:     Mouth: Mucous membranes are moist.  Eyes:     Extraocular Movements: Extraocular movements intact.     Conjunctiva/sclera: Conjunctivae normal.     Pupils: Pupils are equal, round, and reactive to light.     Comments: Erythematous stye present to the center of right upper eyelid  Cardiovascular:     Rate and Rhythm: Normal rate and regular rhythm.     Heart sounds: Normal heart sounds.  Pulmonary:     Effort: Pulmonary effort is normal.     Breath sounds: Normal breath sounds.  Musculoskeletal:        General: Normal range of motion.     Cervical back: Normal range of motion and neck supple.  Skin:    General: Skin is warm and dry.  Neurological:     Mental Status: She is alert and oriented to person, place, and time.  Psychiatric:        Mood and Affect: Mood normal.        Thought Content: Thought content normal.        Judgment: Judgment normal.      UC Treatments / Results  Labs (all labs ordered are listed, but only abnormal results are displayed) Labs Reviewed - No data to display  EKG   Radiology No results found.  Procedures Procedures (including critical care  time)  Medications Ordered in UC Medications - No data to display  Initial Impression / Assessment and Plan / UC Course  I have reviewed the triage vital signs and the nursing notes.  Pertinent labs & imaging results that were available during my care of the patient were reviewed by me and considered in my medical decision making (see chart for details).     Given poor response to topical treatment, will place on a round of Keflex, continue warm compresses, follow-up with eye specialist if not resolving.  Final Clinical Impressions(s) / UC Diagnoses   Final diagnoses:  Hordeolum externum of right upper eyelid   Discharge Instructions   None    ED Prescriptions     Medication Sig Dispense Auth. Provider   cephALEXin (KEFLEX) 500 MG capsule Take 1 capsule (500 mg total) by mouth 2 (two) times daily. 14 capsule Volney American, [place]   fluconazole (DIFLUCAN) 150 MG tablet Take 1 tablet (150 mg total) by mouth once a week. 2 tablet Volney American, [place]      PDMP not reviewed this encounter.   Volney American, [place] 11/21/21 1600

## 2021-12-11 ENCOUNTER — Telehealth: Payer: BC Managed Care – PPO | Admitting: Family Medicine

## 2021-12-11 DIAGNOSIS — H00011 Hordeolum externum right upper eyelid: Secondary | ICD-10-CM

## 2021-12-11 MED ORDER — ERYTHROMYCIN 5 MG/GM OP OINT: 1.0000 | TOPICAL_OINTMENT | Freq: Every day | OPHTHALMIC | 0 refills | Status: DC

## 2021-12-11 NOTE — Patient Instructions (Addendum)
Dennison Mascot, thank you for joining Perlie Mayo, NP for today's virtual visit.  While this provider is not your primary care provider (PCP), if your PCP is located in our provider database this encounter information will be shared with them immediately following your visit.   [facility] account gives you access to today's visit and all your visits, tests, and labs performed at [facility] " click here if you don't have a [facility] account or go to [email or web address]  Consent: (Patient) Madison Armstrong provided verbal consent for this virtual visit at the beginning of the encounter.  Current Medications:  Current Outpatient Medications:    erythromycin ophthalmic ointment, Place 1 Application into the right eye at bedtime., Disp: 3.5 g, Rfl: 0   albuterol (VENTOLIN HFA) 108 (90 Base) MCG/ACT inhaler, Inhale 1-2 puffs into the lungs every 6 (six) hours as needed for wheezing or shortness of breath., Disp: 18 g, Rfl: 1   aspirin EC 325 MG tablet, Take 1 tablet by mouth 2 (two) times daily., Disp: , Rfl:    CELEBREX 200 MG capsule, Take 200 mg by mouth 2 (two) times daily., Disp: , Rfl:    cephALEXin (KEFLEX) 500 MG capsule, Take 1 capsule (500 mg total) by mouth 2 (two) times daily., Disp: 14 capsule, Rfl: 0   cetirizine (ZYRTEC ALLERGY) 10 MG tablet, Take 1 tablet (10 mg total) by mouth daily., Disp: 30 tablet, Rfl: 0   clotrimazole-betamethasone (LOTRISONE) cream, Apply 1 application topically daily. (Patient not taking: Reported on 12/13/2020), Disp: 30 g, Rfl: 2   estradiol (VIVELLE-DOT) 0.025 MG/24HR, Place 1 patch onto the skin 2 (two) times a week., Disp: , Rfl:    estradiol (VIVELLE-DOT) 0.025 MG/24HR, Place onto the skin., Disp: , Rfl:    fluconazole (DIFLUCAN) 150 MG tablet, Take 1 tablet (150 mg total) by mouth once a week., Disp: 2 tablet, Rfl: 0   fluticasone (FLONASE) 50 MCG/ACT nasal spray, Place 1 spray into both nostrils daily for 14  days., Disp: 16 g, Rfl: 0   lidocaine (LIDODERM) 5 %, Place 1 patch onto the skin every 12 (twelve) hours. (Patient not taking: Reported on 12/13/2020), Disp: , Rfl:    Liraglutide -Weight Management (SAXENDA) 18 MG/3ML SOPN, Waiting to start after surgery, Disp: , Rfl:    meclizine (ANTIVERT) 25 MG tablet, Take 1 tablet (25 mg total) by mouth 3 (three) times daily as needed for dizziness., Disp: 15 tablet, Rfl: 0   methocarbamol (ROBAXIN) 500 MG tablet, methocarbamol 500 mg tablet  Take 1 tablet as needed by oral route at bedtime for 14 days., Disp: , Rfl:    mometasone (NASONEX) 50 MCG/ACT nasal spray, Administer 2 sprays in each nostril as needed., Disp: , Rfl:    nadolol (CORGARD) 20 MG tablet, Take by mouth., Disp: , Rfl:    terbinafine (LAMISIL) 250 MG tablet, Take 1 tablet (250 mg total) by mouth daily. (Patient not taking: Reported on 12/13/2020), Disp: 90 tablet, Rfl: 0   terbinafine (LAMISIL) 250 MG tablet, Take 1 tablet (250 mg total) by mouth daily., Disp: 90 tablet, Rfl: 0   Medications ordered in this encounter:  Meds ordered this encounter  Medications   erythromycin ophthalmic ointment    Sig: Place 1 Application into the right eye at bedtime.    Dispense:  3.5 g    Refill:  0    Order Specific Question:   Supervising Provider    Answer:   Lanny Cramp, PHILIP  Jenetta Downer [[id number]]     *If you need refills on other medications prior to your next appointment, please contact your pharmacy*  Follow-Up: Call back or seek an in-person evaluation if the symptoms worsen or if the condition fails to improve as anticipated.  [facility] [phone]  Other Instructions Stye A stye, also known as a hordeolum, is a bump that forms on an eyelid. It may look like a pimple next to the eyelash. A stye can form inside the eyelid (internal stye) or outside the eyelid (external stye). A stye can cause redness, swelling, and pain on the eyelid. Styes are very common. Anyone can get them  at any age. They usually occur in just one eye at a time, but you may have more than one in either eye. What are the causes? A stye is caused by an infection. The infection is almost always caused by bacteria called Staphylococcus aureus. This is a common type of bacteria that lives on the skin. An internal stye may result from an infected oil-producing gland inside the eyelid. An external stye may be caused by an infection at the base of the eyelash (hair follicle). What increases the risk? You are more likely to develop a stye if: You have had a stye before. You have any of these conditions: Red, itchy, inflamed eyelids (blepharitis). A skin condition such as seborrheic dermatitis or rosacea. High fat levels in your blood (lipids). Dry eyes. What are the signs or symptoms? The most common symptom of a stye is eyelid pain. Internal styes are more painful than external styes. Other symptoms may include: Painful swelling of your eyelid. A scratchy feeling in your eye. Tearing and redness of your eye. A pimple-like bump on the edge of the eyelid. Pus draining from the stye. How is this diagnosed? Your health care provider may be able to diagnose a stye just by examining your eye. The health care provider may also check to make sure: You do not have a fever or other signs of a more serious infection. The infection has not spread to other parts of your eye or areas around your eye. How is this treated? Most styes will clear up in a few days without treatment or with warm compresses applied to the area. You may need to use antibiotic drops or ointment to treat an infection. Sometimes, steroid drops or ointment are used in addition to antibiotics. In some cases, your health care provider may give you a small steroid injection in the eyelid. If your stye does not heal with routine treatment, your health care provider may drain pus from the stye using a thin blade or needle. This may be done if the  stye is large, causing a lot of pain, or affecting your vision. Follow these instructions at home: Take over-the-counter and prescription medicines only as told by your health care provider. This includes eye drops or ointments. If you were prescribed an antibiotic medicine, steroid medicine, or both, apply or use them as told by your health care provider. Do not stop using the medicine even if your condition improves. Apply a warm, wet cloth (warm compress) to your eye for 5-10 minutes, 4 to 6 times a day. Clean the affected eyelid as directed by your health care provider. Do not wear contact lenses or eye makeup until your stye has healed and your health care provider says that it is safe. Do not try to pop or drain the stye. Do not rub your  eye. Contact a health care provider if: You have chills or a fever. Your stye does not go away after several days. Your stye affects your vision. Your eyeball becomes swollen, red, or painful. Get help right away if: You have pain when moving your eye around. Summary A stye is a bump that forms on an eyelid. It may look like a pimple next to the eyelash. A stye can form inside the eyelid (internal stye) or outside the eyelid (external stye). A stye can cause redness, swelling, and pain on the eyelid. Your health care provider may be able to diagnose a stye just by examining your eye. Apply a warm, wet cloth (warm compress) to your eye for 5-10 minutes, 4 to 6 times a day. This information is not intended to replace advice given to you by your health care provider. Make sure you discuss any questions you have with your health care provider. Document Revised: 03/08/2020 Document Reviewed: 03/08/2020 Elsevier Patient Education  [employer].     If you have been instructed to have an in-person evaluation today at a local Urgent Care facility, please use the link below. It will take you to a list of all of our available [facility],  including address, phone number and hours of operation. Please do not delay care.  [facility]  If you or a family member do not have a primary care provider, use the link below to schedule a visit and establish care. When you choose a [facility] primary care physician or advanced practice provider, you gain a long-term partner in health. Find a Primary Care Provider  Learn more about [facility]'s in-office and virtual care options: [facility]

## 2021-12-11 NOTE — Progress Notes (Signed)
Virtual Visit Consent   ELESA GARMAN, you are scheduled for a virtual visit with a [place] provider today. Just as with appointments in the office, your consent must be obtained to participate. Your consent will be active for this visit and any virtual visit you may have with one of our providers in the next 365 days. If you have a MyChart account, a copy of this consent can be sent to you electronically.  As this is a virtual visit, video technology does not allow for your provider to perform a traditional examination. This may limit your provider's ability to fully assess your condition. If your provider identifies any concerns that need to be evaluated in person or the need to arrange testing (such as labs, EKG, etc.), we will make arrangements to do so. Although advances in technology are sophisticated, we cannot ensure that it will always work on either your end or our end. If the connection with a video visit is poor, the visit may have to be switched to a telephone visit. With either a video or telephone visit, we are not always able to ensure that we have a secure connection.  By engaging in this virtual visit, you consent to the provision of healthcare and authorize for your insurance to be billed (if applicable) for the services provided during this visit. Depending on your insurance coverage, you may receive a charge related to this service.  I need to obtain your verbal consent now. Are you willing to proceed with your visit today? Madison Armstrong has provided verbal consent on 12/11/2021 for a virtual visit (video or telephone). Perlie Mayo, NP  Date: 12/11/2021 2:14 PM  Virtual Visit via Video Note   I, Perlie Mayo, connected with  Madison Armstrong  ([id number], Mar 08, 1965) on 12/11/21 at  2:00 PM EST by a video-enabled telemedicine application and verified that I am speaking with the correct person using two identifiers.  Location: Patient: Virtual Visit Location  Patient: Home Provider: Virtual Visit Location Provider: Home Office   I discussed the limitations of evaluation and management by telemedicine and the availability of in person appointments. The patient expressed understanding and agreed to proceed.    History of Present Illness: Madison Armstrong is a 56 y.o. who identifies as a female who was assigned female at birth, and is being seen today for recurrent hordeolum of right upper eye lid.  Was treated on 10/30 with polysporin- no improvement later went to ED on 11/8 and was started on Keflex for a week to help- reports improvement but then return after using mascara that caused itching. Denies eye pain, redness of eye, or vision changes. No URI S&S either.   Problems:  Patient Active Problem List   Diagnosis Date Noted   Osteoarthritis of both knees 09/27/2020   Seizure ([place]) 09/27/2020   Patellofemoral arthritis 07/10/2020   Arthralgia of both knees 07/06/2020   Chronic low back pain 07/06/2020   Sacroiliac joint pain 03/22/2019   Pseudoseizures 03/08/2019   Loose body in joint of lower leg 10/02/2018   Hyperlipidemia 04/28/2018   Prediabetes 04/28/2018   Occipital headache 09/23/2017   OSA (obstructive sleep apnea) 09/23/2017   Subclinical hypothyroidism 09/23/2017   Bilateral leg pain 07/03/2017   Annual physical exam 02/17/2017   Chronic pain of both shoulders 02/17/2017   Dyslipidemia 02/17/2017   Vertigo 02/17/2017   Closed fracture of phalanx of foot 11/25/2016   Cyst of ovary 11/25/2016   Chronic allergic  rhinitis 02/15/2016   Class 2 obesity due to excess calories with body mass index (BMI) of 35.0 to 35.9 in adult 02/15/2016   Diet-controlled diabetes mellitus ([place]) 02/15/2016   Left-sided chest pain 02/07/2016   Multiple somatic complaints 09/13/2015   Type 2 diabetes mellitus without complication ([place]) 47/82/9562   Abnormal cervical Papanicolaou smear 01/15/2003    Allergies:  Allergies  Allergen Reactions    Tramadol    Medications:  Current Outpatient Medications:    erythromycin ophthalmic ointment, Place 1 Application into the right eye at bedtime., Disp: 3.5 g, Rfl: 0   albuterol (VENTOLIN HFA) 108 (90 Base) MCG/ACT inhaler, Inhale 1-2 puffs into the lungs every 6 (six) hours as needed for wheezing or shortness of breath., Disp: 18 g, Rfl: 1   aspirin EC 325 MG tablet, Take 1 tablet by mouth 2 (two) times daily., Disp: , Rfl:    CELEBREX 200 MG capsule, Take 200 mg by mouth 2 (two) times daily., Disp: , Rfl:    cephALEXin (KEFLEX) 500 MG capsule, Take 1 capsule (500 mg total) by mouth 2 (two) times daily., Disp: 14 capsule, Rfl: 0   cetirizine (ZYRTEC ALLERGY) 10 MG tablet, Take 1 tablet (10 mg total) by mouth daily., Disp: 30 tablet, Rfl: 0   clotrimazole-betamethasone (LOTRISONE) cream, Apply 1 application topically daily. (Patient not taking: Reported on 12/13/2020), Disp: 30 g, Rfl: 2   estradiol (VIVELLE-DOT) 0.025 MG/24HR, Place 1 patch onto the skin 2 (two) times a week., Disp: , Rfl:    estradiol (VIVELLE-DOT) 0.025 MG/24HR, Place onto the skin., Disp: , Rfl:    fluconazole (DIFLUCAN) 150 MG tablet, Take 1 tablet (150 mg total) by mouth once a week., Disp: 2 tablet, Rfl: 0   fluticasone (FLONASE) 50 MCG/ACT nasal spray, Place 1 spray into both nostrils daily for 14 days., Disp: 16 g, Rfl: 0   lidocaine (LIDODERM) 5 %, Place 1 patch onto the skin every 12 (twelve) hours. (Patient not taking: Reported on 12/13/2020), Disp: , Rfl:    Liraglutide -Weight Management (SAXENDA) 18 MG/3ML SOPN, Waiting to start after surgery, Disp: , Rfl:    meclizine (ANTIVERT) 25 MG tablet, Take 1 tablet (25 mg total) by mouth 3 (three) times daily as needed for dizziness., Disp: 15 tablet, Rfl: 0   methocarbamol (ROBAXIN) 500 MG tablet, methocarbamol 500 mg tablet  Take 1 tablet as needed by oral route at bedtime for 14 days., Disp: , Rfl:    mometasone (NASONEX) 50 MCG/ACT nasal spray, Administer 2 sprays in  each nostril as needed., Disp: , Rfl:    nadolol (CORGARD) 20 MG tablet, Take by mouth., Disp: , Rfl:    terbinafine (LAMISIL) 250 MG tablet, Take 1 tablet (250 mg total) by mouth daily. (Patient not taking: Reported on 12/13/2020), Disp: 90 tablet, Rfl: 0   terbinafine (LAMISIL) 250 MG tablet, Take 1 tablet (250 mg total) by mouth daily., Disp: 90 tablet, Rfl: 0  Observations/Objective: Patient is well-developed, well-nourished in no acute distress.  Resting comfortably  at home.  Head is normocephalic, atraumatic.  No labored breathing.  Speech is clear and coherent with logical content.  Patient is alert and oriented at baseline.    Assessment and Plan: 1. Hordeolum externum of right upper eyelid - erythromycin ophthalmic ointment; Place 1 Application into the right eye at bedtime.  Dispense: 3.5 g; Refill: 0  Wash your hands often! Let the stye open on its own. Don't squeeze or open it. Don't rub your eyes. This  can irritate your eyes and let in bacteria.  If you need to touch your eyes, wash your hands first. Don't wear eye makeup or contact lenses until the area has healed.   Reviewed side effects, risks and benefits of medication.    Patient acknowledged agreement and understanding of the plan.   Past Medical, Surgical, Social History, Allergies, and Medications have been Reviewed.  Be seen in person if not improving   Follow Up Instructions: I discussed the assessment and treatment plan with the patient. The patient was provided an opportunity to ask questions and all were answered. The patient agreed with the plan and demonstrated an understanding of the instructions.  A copy of instructions were sent to the patient via MyChart unless otherwise noted below.     The patient was advised to call back or seek an in-person evaluation if the symptoms worsen or if the condition fails to improve as anticipated.  Time:  I spent 10 minutes with the patient via telehealth  technology discussing the above problems/concerns.    Perlie Mayo, NP

## 2021-12-19 DIAGNOSIS — H0011 Chalazion right upper eyelid: Secondary | ICD-10-CM | POA: Diagnosis not present

## 2021-12-21 DIAGNOSIS — G4733 Obstructive sleep apnea (adult) (pediatric): Secondary | ICD-10-CM | POA: Diagnosis not present

## 2022-01-03 DIAGNOSIS — Z6838 Body mass index (BMI) 38.0-38.9, adult: Secondary | ICD-10-CM | POA: Diagnosis not present

## 2022-01-03 DIAGNOSIS — R6882 Decreased libido: Secondary | ICD-10-CM | POA: Diagnosis not present

## 2022-01-03 DIAGNOSIS — L659 Nonscarring hair loss, unspecified: Secondary | ICD-10-CM | POA: Diagnosis not present

## 2022-01-03 DIAGNOSIS — E669 Obesity, unspecified: Secondary | ICD-10-CM | POA: Diagnosis not present

## 2022-01-21 DIAGNOSIS — G4733 Obstructive sleep apnea (adult) (pediatric): Secondary | ICD-10-CM | POA: Diagnosis not present

## 2022-01-23 DIAGNOSIS — M25561 Pain in right knee: Secondary | ICD-10-CM | POA: Diagnosis not present

## 2022-01-23 DIAGNOSIS — M1712 Unilateral primary osteoarthritis, left knee: Secondary | ICD-10-CM | POA: Diagnosis not present

## 2022-01-30 DIAGNOSIS — M25561 Pain in right knee: Secondary | ICD-10-CM | POA: Diagnosis not present

## 2022-01-30 DIAGNOSIS — Z96651 Presence of right artificial knee joint: Secondary | ICD-10-CM | POA: Diagnosis not present

## 2022-01-30 DIAGNOSIS — M7062 Trochanteric bursitis, left hip: Secondary | ICD-10-CM | POA: Diagnosis not present

## 2022-01-30 DIAGNOSIS — T8484XA Pain due to internal orthopedic prosthetic devices, implants and grafts, initial encounter: Secondary | ICD-10-CM | POA: Diagnosis not present

## 2022-02-15 DIAGNOSIS — K219 Gastro-esophageal reflux disease without esophagitis: Secondary | ICD-10-CM | POA: Diagnosis not present

## 2022-02-15 DIAGNOSIS — Z6838 Body mass index (BMI) 38.0-38.9, adult: Secondary | ICD-10-CM | POA: Diagnosis not present

## 2022-02-15 DIAGNOSIS — E669 Obesity, unspecified: Secondary | ICD-10-CM | POA: Diagnosis not present

## 2022-02-15 DIAGNOSIS — M25561 Pain in right knee: Secondary | ICD-10-CM | POA: Diagnosis not present

## 2022-02-20 DIAGNOSIS — G4733 Obstructive sleep apnea (adult) (pediatric): Secondary | ICD-10-CM | POA: Diagnosis not present

## 2022-03-14 ENCOUNTER — Telehealth: Payer: BC Managed Care – PPO | Admitting: Nurse Practitioner

## 2022-03-14 DIAGNOSIS — H00012 Hordeolum externum right lower eyelid: Secondary | ICD-10-CM

## 2022-03-14 MED ORDER — OFLOXACIN 0.3 % OP SOLN: 1.0000 [drp] | Freq: Four times a day (QID) | OPHTHALMIC | 0 refills | Status: AC

## 2022-03-14 NOTE — Progress Notes (Signed)
Virtual Visit Consent   Madison Armstrong, you are scheduled for a virtual visit with a [facility] provider today. Just as with appointments in the office, your consent must be obtained to participate. Your consent will be active for this visit and any virtual visit you may have with one of our providers in the next 365 days. If you have a MyChart account, a copy of this consent can be sent to you electronically.  As this is a virtual visit, video technology does not allow for your provider to perform a traditional examination. This may limit your provider's ability to fully assess your condition. If your provider identifies any concerns that need to be evaluated in person or the need to arrange testing (such as labs, EKG, etc.), we will make arrangements to do so. Although advances in technology are sophisticated, we cannot ensure that it will always work on either your end or our end. If the connection with a video visit is poor, the visit may have to be switched to a telephone visit. With either a video or telephone visit, we are not always able to ensure that we have a secure connection.  By engaging in this virtual visit, you consent to the provision of healthcare and authorize for your insurance to be billed (if applicable) for the services provided during this visit. Depending on your insurance coverage, you may receive a charge related to this service.  I need to obtain your verbal consent now. Are you willing to proceed with your visit today? TANAJAH BURKHEAD has provided verbal consent on 03/14/2022 for a virtual visit (video or telephone). Apolonio Schneiders, FNP  Date: 03/14/2022 1:39 PM  Virtual Visit via Video Note   I, Apolonio Schneiders, connected with  Madison Armstrong  ([id number], 02-14-1965) on 03/14/22 at  1:45 PM EST by a video-enabled telemedicine application and verified that I am speaking with the correct person using two identifiers.  Location: Patient: Virtual Visit Location Patient:  Home Provider: Virtual Visit Location Provider: Home Office   I discussed the limitations of evaluation and management by telemedicine and the availability of in person appointments. The patient expressed understanding and agreed to proceed.    History of Present Illness: Madison Armstrong is a 57 y.o. who identifies as a female who was assigned female at birth, and is being seen today for a stye.  She had one removed surgically 2 months ago that would not resolve   She recently had one start on her upper lid that self drained.  She has one on her inner lower lid of the right eye.  This is a different area from her surgery  The surgical area was upper lid    She has had crusting and drainage at night    Problems:  Patient Active Problem List   Diagnosis Date Noted   Osteoarthritis of both knees 09/27/2020   Seizure ([place]) 09/27/2020   Patellofemoral arthritis 07/10/2020   Arthralgia of both knees 07/06/2020   Chronic low back pain 07/06/2020   Sacroiliac joint pain 03/22/2019   Pseudoseizures 03/08/2019   Loose body in joint of lower leg 10/02/2018   Hyperlipidemia 04/28/2018   Prediabetes 04/28/2018   Occipital headache 09/23/2017   OSA (obstructive sleep apnea) 09/23/2017   Subclinical hypothyroidism 09/23/2017   Bilateral leg pain 07/03/2017   Annual physical exam 02/17/2017   Chronic pain of both shoulders 02/17/2017   Dyslipidemia 02/17/2017   Vertigo 02/17/2017   Closed fracture of phalanx of  foot 11/25/2016   Cyst of ovary 11/25/2016   Chronic allergic rhinitis 02/15/2016   Class 2 obesity due to excess calories with body mass index (BMI) of 35.0 to 35.9 in adult 02/15/2016   Diet-controlled diabetes mellitus ([place]) 02/15/2016   Left-sided chest pain 02/07/2016   Multiple somatic complaints 09/13/2015   Type 2 diabetes mellitus without complication ([place]) 123456   Abnormal cervical Papanicolaou smear 01/15/2003    Allergies:  Allergies  Allergen Reactions    Tramadol    Medications:  Current Outpatient Medications:    albuterol (VENTOLIN HFA) 108 (90 Base) MCG/ACT inhaler, Inhale 1-2 puffs into the lungs every 6 (six) hours as needed for wheezing or shortness of breath., Disp: 18 g, Rfl: 1   aspirin EC 325 MG tablet, Take 1 tablet by mouth 2 (two) times daily., Disp: , Rfl:    CELEBREX 200 MG capsule, Take 200 mg by mouth 2 (two) times daily., Disp: , Rfl:    cephALEXin (KEFLEX) 500 MG capsule, Take 1 capsule (500 mg total) by mouth 2 (two) times daily., Disp: 14 capsule, Rfl: 0   cetirizine (ZYRTEC ALLERGY) 10 MG tablet, Take 1 tablet (10 mg total) by mouth daily., Disp: 30 tablet, Rfl: 0   clotrimazole-betamethasone (LOTRISONE) cream, Apply 1 application topically daily. (Patient not taking: Reported on 12/13/2020), Disp: 30 g, Rfl: 2   erythromycin ophthalmic ointment, Place 1 Application into the right eye at bedtime., Disp: 3.5 g, Rfl: 0   estradiol (VIVELLE-DOT) 0.025 MG/24HR, Place 1 patch onto the skin 2 (two) times a week., Disp: , Rfl:    estradiol (VIVELLE-DOT) 0.025 MG/24HR, Place onto the skin., Disp: , Rfl:    fluconazole (DIFLUCAN) 150 MG tablet, Take 1 tablet (150 mg total) by mouth once a week., Disp: 2 tablet, Rfl: 0   fluticasone (FLONASE) 50 MCG/ACT nasal spray, Place 1 spray into both nostrils daily for 14 days., Disp: 16 g, Rfl: 0   lidocaine (LIDODERM) 5 %, Place 1 patch onto the skin every 12 (twelve) hours. (Patient not taking: Reported on 12/13/2020), Disp: , Rfl:    Liraglutide -Weight Management (SAXENDA) 18 MG/3ML SOPN, Waiting to start after surgery, Disp: , Rfl:    meclizine (ANTIVERT) 25 MG tablet, Take 1 tablet (25 mg total) by mouth 3 (three) times daily as needed for dizziness., Disp: 15 tablet, Rfl: 0   methocarbamol (ROBAXIN) 500 MG tablet, methocarbamol 500 mg tablet  Take 1 tablet as needed by oral route at bedtime for 14 days., Disp: , Rfl:    mometasone (NASONEX) 50 MCG/ACT nasal spray, Administer 2 sprays in  each nostril as needed., Disp: , Rfl:    nadolol (CORGARD) 20 MG tablet, Take by mouth., Disp: , Rfl:    terbinafine (LAMISIL) 250 MG tablet, Take 1 tablet (250 mg total) by mouth daily. (Patient not taking: Reported on 12/13/2020), Disp: 90 tablet, Rfl: 0   terbinafine (LAMISIL) 250 MG tablet, Take 1 tablet (250 mg total) by mouth daily., Disp: 90 tablet, Rfl: 0  Observations/Objective: Patient is well-developed, well-nourished in no acute distress.  Resting comfortably  at home.  Head is normocephalic, atraumatic.  No labored breathing.  Speech is clear and coherent with logical content.  Patient is alert and oriented at baseline.    Assessment and Plan:  1. Hordeolum externum of right lower eyelid Continue to use warm compresses to area prior to drops   - ofloxacin (OCUFLOX) 0.3 % ophthalmic solution; Place 1 drop into the right eye 4 (four)  times daily for 5 days.  Dispense: 5 mL; Refill: 0     Follow Up Instructions: I discussed the assessment and treatment plan with the patient. The patient was provided an opportunity to ask questions and all were answered. The patient agreed with the plan and demonstrated an understanding of the instructions.  A copy of instructions were sent to the patient via MyChart unless otherwise noted below.    The patient was advised to call back or seek an in-person evaluation if the symptoms worsen or if the condition fails to improve as anticipated.  Time:  I spent 9 minutes with the patient via telehealth technology discussing the above problems/concerns.    Apolonio Schneiders, FNP

## 2022-03-21 DIAGNOSIS — M7631 Iliotibial band syndrome, right leg: Secondary | ICD-10-CM | POA: Diagnosis not present

## 2022-04-02 DIAGNOSIS — H0100A Unspecified blepharitis right eye, upper and lower eyelids: Secondary | ICD-10-CM | POA: Diagnosis not present

## 2022-04-02 DIAGNOSIS — H1013 Acute atopic conjunctivitis, bilateral: Secondary | ICD-10-CM | POA: Diagnosis not present

## 2022-04-08 DIAGNOSIS — Z96651 Presence of right artificial knee joint: Secondary | ICD-10-CM | POA: Diagnosis not present

## 2022-04-08 DIAGNOSIS — G8929 Other chronic pain: Secondary | ICD-10-CM | POA: Diagnosis not present

## 2022-04-08 DIAGNOSIS — M7631 Iliotibial band syndrome, right leg: Secondary | ICD-10-CM | POA: Diagnosis not present

## 2022-04-08 DIAGNOSIS — M25561 Pain in right knee: Secondary | ICD-10-CM | POA: Diagnosis not present

## 2022-04-15 DIAGNOSIS — M25561 Pain in right knee: Secondary | ICD-10-CM | POA: Diagnosis not present

## 2022-04-15 DIAGNOSIS — G8929 Other chronic pain: Secondary | ICD-10-CM | POA: Diagnosis not present

## 2022-04-15 DIAGNOSIS — M7631 Iliotibial band syndrome, right leg: Secondary | ICD-10-CM | POA: Diagnosis not present

## 2022-04-15 DIAGNOSIS — Z96651 Presence of right artificial knee joint: Secondary | ICD-10-CM | POA: Diagnosis not present

## 2022-04-22 DIAGNOSIS — M7631 Iliotibial band syndrome, right leg: Secondary | ICD-10-CM | POA: Diagnosis not present

## 2022-04-22 DIAGNOSIS — M25561 Pain in right knee: Secondary | ICD-10-CM | POA: Diagnosis not present

## 2022-04-22 DIAGNOSIS — Z96651 Presence of right artificial knee joint: Secondary | ICD-10-CM | POA: Diagnosis not present

## 2022-04-22 DIAGNOSIS — G8929 Other chronic pain: Secondary | ICD-10-CM | POA: Diagnosis not present

## 2022-04-29 DIAGNOSIS — Z96651 Presence of right artificial knee joint: Secondary | ICD-10-CM | POA: Diagnosis not present

## 2022-04-29 DIAGNOSIS — M25561 Pain in right knee: Secondary | ICD-10-CM | POA: Diagnosis not present

## 2022-04-29 DIAGNOSIS — G8929 Other chronic pain: Secondary | ICD-10-CM | POA: Diagnosis not present

## 2022-05-16 DIAGNOSIS — E669 Obesity, unspecified: Secondary | ICD-10-CM | POA: Diagnosis not present

## 2022-05-16 DIAGNOSIS — L659 Nonscarring hair loss, unspecified: Secondary | ICD-10-CM | POA: Diagnosis not present

## 2022-05-16 DIAGNOSIS — R6882 Decreased libido: Secondary | ICD-10-CM | POA: Diagnosis not present

## 2022-05-21 DIAGNOSIS — G4733 Obstructive sleep apnea (adult) (pediatric): Secondary | ICD-10-CM | POA: Diagnosis not present

## 2022-05-30 DIAGNOSIS — Z96651 Presence of right artificial knee joint: Secondary | ICD-10-CM | POA: Diagnosis not present

## 2022-05-30 DIAGNOSIS — M899 Disorder of bone, unspecified: Secondary | ICD-10-CM | POA: Diagnosis not present

## 2022-05-30 DIAGNOSIS — M25561 Pain in right knee: Secondary | ICD-10-CM | POA: Diagnosis not present

## 2022-05-30 DIAGNOSIS — G8929 Other chronic pain: Secondary | ICD-10-CM | POA: Diagnosis not present

## 2022-05-30 DIAGNOSIS — M1712 Unilateral primary osteoarthritis, left knee: Secondary | ICD-10-CM | POA: Diagnosis not present

## 2022-06-20 DIAGNOSIS — K219 Gastro-esophageal reflux disease without esophagitis: Secondary | ICD-10-CM | POA: Diagnosis not present

## 2022-06-20 DIAGNOSIS — E669 Obesity, unspecified: Secondary | ICD-10-CM | POA: Diagnosis not present

## 2022-06-20 DIAGNOSIS — Z6838 Body mass index (BMI) 38.0-38.9, adult: Secondary | ICD-10-CM | POA: Diagnosis not present

## 2022-06-20 DIAGNOSIS — R6882 Decreased libido: Secondary | ICD-10-CM | POA: Diagnosis not present

## 2022-06-26 DIAGNOSIS — Z96651 Presence of right artificial knee joint: Secondary | ICD-10-CM | POA: Diagnosis not present

## 2022-06-26 DIAGNOSIS — M1712 Unilateral primary osteoarthritis, left knee: Secondary | ICD-10-CM | POA: Diagnosis not present

## 2022-07-03 DIAGNOSIS — L819 Disorder of pigmentation, unspecified: Secondary | ICD-10-CM | POA: Diagnosis not present

## 2022-07-03 DIAGNOSIS — L659 Nonscarring hair loss, unspecified: Secondary | ICD-10-CM | POA: Diagnosis not present

## 2022-07-03 DIAGNOSIS — I8393 Asymptomatic varicose veins of bilateral lower extremities: Secondary | ICD-10-CM | POA: Diagnosis not present

## 2022-07-03 DIAGNOSIS — D2330 Other benign neoplasm of skin of unspecified part of face: Secondary | ICD-10-CM | POA: Diagnosis not present

## 2022-07-09 DIAGNOSIS — R42 Dizziness and giddiness: Secondary | ICD-10-CM | POA: Diagnosis not present

## 2022-07-16 ENCOUNTER — Other Ambulatory Visit: Payer: Self-pay | Admitting: Family Medicine

## 2022-07-16 ENCOUNTER — Other Ambulatory Visit: Payer: Self-pay | Admitting: Nurse Practitioner

## 2022-07-16 DIAGNOSIS — H00011 Hordeolum externum right upper eyelid: Secondary | ICD-10-CM

## 2022-07-16 DIAGNOSIS — H00012 Hordeolum externum right lower eyelid: Secondary | ICD-10-CM

## 2022-07-17 NOTE — Telephone Encounter (Signed)
Unable to refill per protocol, last refill by another provider not at this practice.  Requested Prescriptions  Pending Prescriptions Disp Refills   cephALEXin (KEFLEX) 500 MG capsule [Pharmacy Med Name: CEPHALEXIN 500 MG CAPSULE] 14 capsule 0    Sig: TAKE 1 CAPSULE BY MOUTH TWICE A DAY     There is no refill protocol information for this order

## 2022-08-19 DIAGNOSIS — G4733 Obstructive sleep apnea (adult) (pediatric): Secondary | ICD-10-CM | POA: Diagnosis not present

## 2022-08-30 DIAGNOSIS — M25552 Pain in left hip: Secondary | ICD-10-CM | POA: Diagnosis not present

## 2022-08-30 DIAGNOSIS — E669 Obesity, unspecified: Secondary | ICD-10-CM | POA: Diagnosis not present

## 2022-08-30 DIAGNOSIS — K219 Gastro-esophageal reflux disease without esophagitis: Secondary | ICD-10-CM | POA: Diagnosis not present

## 2022-09-19 DIAGNOSIS — M533 Sacrococcygeal disorders, not elsewhere classified: Secondary | ICD-10-CM | POA: Diagnosis not present

## 2022-09-19 DIAGNOSIS — M545 Low back pain, unspecified: Secondary | ICD-10-CM | POA: Diagnosis not present

## 2022-09-19 DIAGNOSIS — M25552 Pain in left hip: Secondary | ICD-10-CM | POA: Diagnosis not present

## 2022-10-08 DIAGNOSIS — M545 Low back pain, unspecified: Secondary | ICD-10-CM | POA: Diagnosis not present

## 2022-10-20 ENCOUNTER — Ambulatory Visit: Payer: BC Managed Care – PPO

## 2022-11-18 DIAGNOSIS — G4733 Obstructive sleep apnea (adult) (pediatric): Secondary | ICD-10-CM | POA: Diagnosis not present

## 2022-11-29 DIAGNOSIS — L659 Nonscarring hair loss, unspecified: Secondary | ICD-10-CM | POA: Diagnosis not present

## 2022-11-29 DIAGNOSIS — R6882 Decreased libido: Secondary | ICD-10-CM | POA: Diagnosis not present

## 2022-11-29 DIAGNOSIS — Z6838 Body mass index (BMI) 38.0-38.9, adult: Secondary | ICD-10-CM | POA: Diagnosis not present

## 2022-11-29 DIAGNOSIS — E669 Obesity, unspecified: Secondary | ICD-10-CM | POA: Diagnosis not present

## 2022-12-02 DIAGNOSIS — L819 Disorder of pigmentation, unspecified: Secondary | ICD-10-CM | POA: Diagnosis not present

## 2022-12-02 DIAGNOSIS — D2330 Other benign neoplasm of skin of unspecified part of face: Secondary | ICD-10-CM | POA: Diagnosis not present

## 2022-12-02 DIAGNOSIS — L659 Nonscarring hair loss, unspecified: Secondary | ICD-10-CM | POA: Diagnosis not present

## 2022-12-25 DIAGNOSIS — D2310 Other benign neoplasm of skin of unspecified eyelid, including canthus: Secondary | ICD-10-CM | POA: Diagnosis not present

## 2023-02-07 DIAGNOSIS — M25361 Other instability, right knee: Secondary | ICD-10-CM | POA: Diagnosis not present

## 2023-02-07 DIAGNOSIS — M1712 Unilateral primary osteoarthritis, left knee: Secondary | ICD-10-CM | POA: Diagnosis not present

## 2023-02-17 DIAGNOSIS — G4733 Obstructive sleep apnea (adult) (pediatric): Secondary | ICD-10-CM | POA: Diagnosis not present

## 2023-02-19 ENCOUNTER — Telehealth: Payer: BC Managed Care – PPO | Admitting: Physician Assistant

## 2023-02-19 DIAGNOSIS — R6889 Other general symptoms and signs: Secondary | ICD-10-CM | POA: Diagnosis not present

## 2023-02-19 MED ORDER — OSELTAMIVIR PHOSPHATE 75 MG PO CAPS: 75.0000 mg | ORAL_CAPSULE | Freq: Two times a day (BID) | ORAL | 0 refills | Status: AC

## 2023-02-19 MED ORDER — BENZONATATE 100 MG PO CAPS: 100.0000 mg | ORAL_CAPSULE | Freq: Three times a day (TID) | ORAL | 0 refills | Status: AC | PRN

## 2023-02-19 NOTE — Progress Notes (Signed)
 Virtual Visit Consent   Madison Armstrong, you are scheduled for a virtual visit with a [facility] provider today. Just as with appointments in the office, your consent must be obtained to participate. Your consent will be active for this visit and any virtual visit you may have with one of our providers in the next 365 days. If you have a MyChart account, a copy of this consent can be sent to you electronically.  As this is a virtual visit, video technology does not allow for your provider to perform a traditional examination. This may limit your provider's ability to fully assess your condition. If your provider identifies any concerns that need to be evaluated in person or the need to arrange testing (such as labs, EKG, etc.), we will make arrangements to do so. Although advances in technology are sophisticated, we cannot ensure that it will always work on either your end or our end. If the connection with a video visit is poor, the visit may have to be switched to a telephone visit. With either a video or telephone visit, we are not always able to ensure that we have a secure connection.  By engaging in this virtual visit, you consent to the provision of healthcare and authorize for your insurance to be billed (if applicable) for the services provided during this visit. Depending on your insurance coverage, you may receive a charge related to this service.  I need to obtain your verbal consent now. Are you willing to proceed with your visit today? [name] has provided verbal consent on 02/19/2023 for a virtual visit (video or telephone). Madison Armstrong, [place]  Date: 02/19/2023 3:57 PM  Virtual Visit via Video Note   I, Madison Armstrong, connected with  [name]  ([id number], 01/20/1965) on 02/19/23 at  4:00 PM EST by a video-enabled telemedicine application and verified that I am speaking with the correct person using two identifiers.  Location: Patient: Virtual Visit  Location Patient: Home Provider: Virtual Visit Location Provider: Home Office   I discussed the limitations of evaluation and management by telemedicine and the availability of in person appointments. The patient expressed understanding and agreed to proceed.    History of Present Illness: Madison Armstrong is a 58 y.o. who identifies as a female who was assigned female at birth, and is being seen today for concern of influenza.  Patient endorses symptom onset yesterday with headache, sore throat, cough, chills and fever with Tmax of 102.5.  Symptoms have continued into today with noted chest while tenderness secondary to coughing.  Denies any overt chest pain.  Denies shortness of breath.  Denies GI symptoms.  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   HPI: HPI  Problems:  Patient Active Problem List   Diagnosis Date Noted   Osteoarthritis of both knees 09/27/2020   Seizure (HCC) 09/27/2020   Patellofemoral arthritis 07/10/2020   Arthralgia of both knees 07/06/2020   Chronic low back pain 07/06/2020   Sacroiliac joint pain 03/22/2019   Psychogenic nonepileptic seizure 03/08/2019   Loose body in joint of lower leg 10/02/2018   Hyperlipidemia 04/28/2018   Prediabetes 04/28/2018   Occipital headache 09/23/2017   OSA (obstructive sleep apnea) 09/23/2017   Subclinical hypothyroidism 09/23/2017   Bilateral leg pain 07/03/2017   Annual physical exam 02/17/2017   Chronic pain of both shoulders 02/17/2017   Dyslipidemia 02/17/2017   Vertigo 02/17/2017   Closed fracture of  phalanx of foot 11/25/2016   Cyst of ovary 11/25/2016   Chronic allergic rhinitis 02/15/2016   Class 2 obesity due to excess calories with body mass index (BMI) of 35.0 to 35.9 in adult 02/15/2016   Diet-controlled diabetes mellitus (HCC) 02/15/2016   Left-sided chest pain 02/07/2016   Multiple somatic complaints 09/13/2015   Type 2 diabetes mellitus without complication  (HCC) 03/01/2015   Abnormal cervical Papanicolaou smear 01/15/2003    Allergies:  Allergies  Allergen Reactions   Tramadol    Medications:  Current Outpatient Medications:    benzonatate  (TESSALON ) 100 MG capsule, Take 1 capsule (100 mg total) by mouth 3 (three) times daily as needed for cough., Disp: 30 capsule, Rfl: 0   oseltamivir  (TAMIFLU ) 75 MG capsule, Take 1 capsule (75 mg total) by mouth 2 (two) times daily for 5 days., Disp: 10 capsule, Rfl: 0   albuterol  (VENTOLIN  HFA) 108 (90 Base) MCG/ACT inhaler, Inhale 1-2 puffs into the lungs every 6 (six) hours as needed for wheezing or shortness of breath., Disp: 18 g, Rfl: 1   aspirin EC 325 MG tablet, Take 1 tablet by mouth 2 (two) times daily., Disp: , Rfl:    CELEBREX 200 MG capsule, Take 200 mg by mouth 2 (two) times daily., Disp: , Rfl:    estradiol  (VIVELLE -DOT) 0.025 MG/24HR, Place 1 patch onto the skin 2 (two) times a week., Disp: , Rfl:    estradiol  (VIVELLE -DOT) 0.025 MG/24HR, Place onto the skin., Disp: , Rfl:    Liraglutide -Weight Management (SAXENDA) 18 MG/3ML SOPN, Waiting to start after surgery, Disp: , Rfl:    meclizine  (ANTIVERT ) 25 MG tablet, Take 1 tablet (25 mg total) by mouth 3 (three) times daily as needed for dizziness., Disp: 15 tablet, Rfl: 0   nadolol (CORGARD) 20 MG tablet, Take by mouth., Disp: , Rfl:   Observations/Objective: Patient is well-developed, well-nourished in no acute distress.  Resting comfortably  at home.  Head is normocephalic, atraumatic.  No labored breathing.  Speech is clear and coherent with logical content.  Patient is alert and oriented at baseline.    Assessment and Plan: 1. Flu-like symptoms (Primary) - benzonatate  (TESSALON ) 100 MG capsule; Take 1 capsule (100 mg total) by mouth 3 (three) times daily as needed for cough.  Dispense: 30 capsule; Refill: 0 - oseltamivir  (TAMIFLU ) 75 MG capsule; Take 1 capsule (75 mg total) by mouth 2 (two) times daily for 5 days.  Dispense: 10  capsule; Refill: 0  Classic influenza symptoms.  No exposure. Supportive measures, OTC medications and Vitamin recommendations reviewed. Will start Tamiflu  per orders. Tessalon  per orders. Quarantine reviewed with patient.    Follow Up Instructions: I discussed the assessment and treatment plan with the patient. The patient was provided an opportunity to ask questions and all were answered. The patient agreed with the plan and demonstrated an understanding of the instructions.  A copy of instructions were sent to the patient via MyChart unless otherwise noted below.   The patient was advised to call back or seek an in-person evaluation if the symptoms worsen or if the condition fails to improve as anticipated.    Madison Velma Lunger, PA-C

## 2023-02-19 NOTE — Patient Instructions (Signed)
 Alfonso GORMAN Norlander, thank you for joining [name], PA-C for today's virtual visit.  While this provider is not your primary care provider (PCP), if your PCP is located in our provider database this encounter information will be shared with them immediately following your visit.   A [facility] MyChart account gives you access to today's visit and all your visits, tests, and labs performed at [facility]  click here if you don't have a [facility] MyChart account or go to [email or web address]  Consent: (Patient) Madison Armstrong provided verbal consent for this virtual visit at the beginning of the encounter.  Current Medications:  Current Outpatient Medications:    albuterol  (VENTOLIN  HFA) 108 (90 Base) MCG/ACT inhaler, Inhale 1-2 puffs into the lungs every 6 (six) hours as needed for wheezing or shortness of breath., Disp: 18 g, Rfl: 1   aspirin EC 325 MG tablet, Take 1 tablet by mouth 2 (two) times daily., Disp: , Rfl:    CELEBREX 200 MG capsule, Take 200 mg by mouth 2 (two) times daily., Disp: , Rfl:    cephALEXin  (KEFLEX ) 500 MG capsule, Take 1 capsule (500 mg total) by mouth 2 (two) times daily., Disp: 14 capsule, Rfl: 0   cetirizine  (ZYRTEC  ALLERGY) 10 MG tablet, Take 1 tablet (10 mg total) by mouth daily., Disp: 30 tablet, Rfl: 0   clotrimazole -betamethasone  (LOTRISONE ) cream, Apply 1 application topically daily. (Patient not taking: Reported on 12/13/2020), Disp: 30 g, Rfl: 2   erythromycin  ophthalmic ointment, Place 1 Application into the right eye at bedtime., Disp: 3.5 g, Rfl: 0   estradiol  (VIVELLE -DOT) 0.025 MG/24HR, Place 1 patch onto the skin 2 (two) times a week., Disp: , Rfl:    estradiol  (VIVELLE -DOT) 0.025 MG/24HR, Place onto the skin., Disp: , Rfl:    fluconazole  (DIFLUCAN ) 150 MG tablet, Take 1 tablet (150 mg total) by mouth once a week., Disp: 2 tablet, Rfl: 0   fluticasone  (FLONASE ) 50 MCG/ACT nasal spray, Place 1 spray into both nostrils daily  for 14 days., Disp: 16 g, Rfl: 0   lidocaine  (LIDODERM ) 5 %, Place 1 patch onto the skin every 12 (twelve) hours. (Patient not taking: Reported on 12/13/2020), Disp: , Rfl:    Liraglutide -Weight Management (SAXENDA) 18 MG/3ML SOPN, Waiting to start after surgery, Disp: , Rfl:    meclizine  (ANTIVERT ) 25 MG tablet, Take 1 tablet (25 mg total) by mouth 3 (three) times daily as needed for dizziness., Disp: 15 tablet, Rfl: 0   methocarbamol (ROBAXIN) 500 MG tablet, methocarbamol 500 mg tablet  Take 1 tablet as needed by oral route at bedtime for 14 days., Disp: , Rfl:    mometasone (NASONEX) 50 MCG/ACT nasal spray, Administer 2 sprays in each nostril as needed., Disp: , Rfl:    nadolol (CORGARD) 20 MG tablet, Take by mouth., Disp: , Rfl:    terbinafine  (LAMISIL ) 250 MG tablet, Take 1 tablet (250 mg total) by mouth daily. (Patient not taking: Reported on 12/13/2020), Disp: 90 tablet, Rfl: 0   terbinafine  (LAMISIL ) 250 MG tablet, Take 1 tablet (250 mg total) by mouth daily., Disp: 90 tablet, Rfl: 0   Medications ordered in this encounter:  No orders of the defined types were placed in this encounter.    *If you need refills on other medications prior to your next appointment, please contact your pharmacy*  Follow-Up: Call back or seek an in-person evaluation if the symptoms worsen or if the condition fails to improve as anticipated.  [facility]  Virtual Care [phone]  Other Instructions Please keep well-hydrated and try to get plenty of rest. If you have a humidifier, place it in the bedroom and run it at night. Start a saline nasal rinse for nasal congestion. You can consider use of a nasal steroid spray like Flonase  or Nasacort OTC. You can alternate between Tylenol  and Ibuprofen if needed for fever, body aches, headache and/or throat pain. Salt water-gargles and chloraseptic spray can be very beneficial for sore throat. Mucinex-DM for congestion or cough. Please take all prescribed  medications as directed.  Remain out of work until [employer] for 24 hours without a fever-reducing medication, and you are feeling better.  You should mask until symptoms are resolved.  If anything worsens despite treatment, you need to be evaluated in-person. Please do not delay care.  Influenza, Adult Influenza is also called the flu. It is an infection in the lungs, nose, and throat (respiratory tract). It spreads easily from person to person (is contagious). The flu causes symptoms that are like a cold, along with high fever and body aches. What are the causes? This condition is caused by the influenza virus. You can get the virus by: Breathing in droplets that are in the air after a person infected with the flu coughed or sneezed. Touching something that has the virus on it and then touching your mouth, nose, or eyes. What increases the risk? Certain things may make you more likely to get the flu. These include: Not washing your hands often. Having close contact with many people during cold and flu season. Touching your mouth, eyes, or nose without first washing your hands. Not getting a flu shot every year. You may have a higher risk for the flu, and serious problems, such as a lung infection (pneumonia), if you: Are older than 65. Are pregnant. Have a weakened disease-fighting system (immune system) because of a disease or because you are taking certain medicines. Have a long-term (chronic) condition, such as: Heart, kidney, or lung disease. Diabetes. Asthma. Have a liver disorder. Are very overweight (morbidly obese). Have anemia. What are the signs or symptoms? Symptoms usually begin suddenly and last 4-14 days. They may include: Fever and chills. Headaches, body aches, or muscle aches. Sore throat. Cough. Runny or stuffy (congested) nose. Feeling discomfort in your chest. Not wanting to eat as much as normal. Feeling weak or tired. Feeling dizzy. Feeling sick to  your stomach or throwing up. How is this treated? If the flu is found early, you can be treated with antiviral medicine. This can help to reduce how bad the illness is and how long it lasts. This may be given by mouth or through an IV tube. Taking care of yourself at home can help your symptoms get better. Your doctor may want you to: Take over-the-counter medicines. Drink plenty of fluids. The flu often goes away on its own. If you have very bad symptoms or other problems, you may be treated in a hospital. Follow these instructions at home:     Activity Rest as needed. Get plenty of sleep. Stay home from work or school as told by your doctor. Do not leave home until you do not have a fever for 24 hours without taking medicine. Leave home only to go to your doctor. Eating and drinking Take an ORS (oral rehydration solution). This is a drink that is sold at pharmacies and stores. Drink enough fluid to keep your pee pale yellow. Drink clear fluids in small amounts  as you are able. Clear fluids include: Water. Ice chips. Fruit juice mixed with water. Low-calorie sports drinks. Eat bland foods that are easy to digest. Eat small amounts as you are able. These foods include: Bananas. Applesauce. Rice. Lean meats. Toast. Crackers. Do not eat or drink: Fluids that have a lot of sugar or caffeine . Alcohol. Spicy or fatty foods. General instructions Take over-the-counter and prescription medicines only as told by your doctor. Use a cool mist humidifier to add moisture to the air in your home. This can make it easier for you to breathe. When using a cool mist humidifier, clean it daily. Empty water and replace with clean water. Cover your mouth and nose when you cough or sneeze. Wash your hands with soap and water often and for at least 20 seconds. This is also important after you cough or sneeze. If you cannot use soap and water, use alcohol-based hand sanitizer. Keep all follow-up  visits. How is this prevented?  Get a flu shot every year. You may get the flu shot in late summer, fall, or winter. Ask your doctor when you should get your flu shot. Avoid contact with people who are sick during fall and winter. This is cold and flu season. Contact a doctor if: You get new symptoms. You have: Chest pain. Watery poop (diarrhea). A fever. Your cough gets worse. You start to have more mucus. You feel sick to your stomach. You throw up. Get help right away if you: Have shortness of breath. Have trouble breathing. Have skin or nails that turn a bluish color. Have very bad pain or stiffness in your neck. Get a sudden headache. Get sudden pain in your face or ear. Cannot eat or drink without throwing up. These symptoms may represent a serious problem that is an emergency. Get medical help right away. Call your local emergency services (911 in the [place] Do not wait to see if the symptoms will go away. Do not drive yourself to the hospital. Summary Influenza is also called the flu. It is an infection in the lungs, nose, and throat. It spreads easily from person to person. Take over-the-counter and prescription medicines only as told by your doctor. Getting a flu shot every year is the best way to not get the flu. This information is not intended to replace advice given to you by your health care provider. Make sure you discuss any questions you have with your health care provider. Document Revised: 08/20/2019 Document Reviewed: 08/20/2019 Elsevier Patient Education  2023 Elsevier Inc.      If you have been instructed to have an in-person evaluation today at a local Urgent Care facility, please use the link below. It will take you to a list of all of our available [facility], including address, phone number and hours of operation. Please do not delay care.  [facility]  If you or a family member do not have a primary care provider, use  the link below to schedule a visit and establish care. When you choose a [facility] primary care physician or advanced practice provider, you gain a long-term partner in health. Find a Primary Care Provider  Learn more about [facility] in-office and virtual care options: [facility] - Get Care Now

## 2023-03-04 DIAGNOSIS — E669 Obesity, unspecified: Secondary | ICD-10-CM | POA: Diagnosis not present

## 2023-03-04 DIAGNOSIS — Z8041 Family history of malignant neoplasm of ovary: Secondary | ICD-10-CM | POA: Diagnosis not present

## 2023-03-04 DIAGNOSIS — R6882 Decreased libido: Secondary | ICD-10-CM | POA: Diagnosis not present

## 2023-03-04 DIAGNOSIS — M25552 Pain in left hip: Secondary | ICD-10-CM | POA: Diagnosis not present

## 2023-03-07 DIAGNOSIS — R1032 Left lower quadrant pain: Secondary | ICD-10-CM | POA: Diagnosis not present

## 2023-03-07 DIAGNOSIS — R1909 Other intra-abdominal and pelvic swelling, mass and lump: Secondary | ICD-10-CM | POA: Diagnosis not present

## 2023-03-11 DIAGNOSIS — T84498D Other mechanical complication of other internal orthopedic devices, implants and grafts, subsequent encounter: Secondary | ICD-10-CM | POA: Diagnosis not present

## 2023-03-11 DIAGNOSIS — M25361 Other instability, right knee: Secondary | ICD-10-CM | POA: Diagnosis not present

## 2023-03-11 DIAGNOSIS — M1712 Unilateral primary osteoarthritis, left knee: Secondary | ICD-10-CM | POA: Diagnosis not present

## 2023-04-09 DIAGNOSIS — Z90711 Acquired absence of uterus with remaining cervical stump: Secondary | ICD-10-CM | POA: Diagnosis not present

## 2023-04-09 DIAGNOSIS — Z01419 Encounter for gynecological examination (general) (routine) without abnormal findings: Secondary | ICD-10-CM | POA: Diagnosis not present

## 2023-04-09 DIAGNOSIS — Z1239 Encounter for other screening for malignant neoplasm of breast: Secondary | ICD-10-CM | POA: Diagnosis not present

## 2023-04-09 DIAGNOSIS — Z1231 Encounter for screening mammogram for malignant neoplasm of breast: Secondary | ICD-10-CM | POA: Diagnosis not present

## 2023-04-09 DIAGNOSIS — R1032 Left lower quadrant pain: Secondary | ICD-10-CM | POA: Diagnosis not present

## 2023-04-09 DIAGNOSIS — R1909 Other intra-abdominal and pelvic swelling, mass and lump: Secondary | ICD-10-CM | POA: Diagnosis not present

## 2023-04-17 DIAGNOSIS — M25561 Pain in right knee: Secondary | ICD-10-CM | POA: Diagnosis not present

## 2023-04-17 DIAGNOSIS — Z96651 Presence of right artificial knee joint: Secondary | ICD-10-CM | POA: Diagnosis not present

## 2023-04-17 DIAGNOSIS — G8929 Other chronic pain: Secondary | ICD-10-CM | POA: Diagnosis not present

## 2023-05-19 DIAGNOSIS — G4733 Obstructive sleep apnea (adult) (pediatric): Secondary | ICD-10-CM | POA: Diagnosis not present

## 2023-06-03 DIAGNOSIS — R6882 Decreased libido: Secondary | ICD-10-CM | POA: Diagnosis not present

## 2023-06-03 DIAGNOSIS — E669 Obesity, unspecified: Secondary | ICD-10-CM | POA: Diagnosis not present

## 2023-06-03 DIAGNOSIS — Z6838 Body mass index (BMI) 38.0-38.9, adult: Secondary | ICD-10-CM | POA: Diagnosis not present

## 2023-06-03 DIAGNOSIS — L659 Nonscarring hair loss, unspecified: Secondary | ICD-10-CM | POA: Diagnosis not present

## 2023-07-16 DIAGNOSIS — D239 Other benign neoplasm of skin, unspecified: Secondary | ICD-10-CM | POA: Diagnosis not present

## 2023-07-16 DIAGNOSIS — L658 Other specified nonscarring hair loss: Secondary | ICD-10-CM | POA: Diagnosis not present

## 2023-07-16 DIAGNOSIS — L219 Seborrheic dermatitis, unspecified: Secondary | ICD-10-CM | POA: Diagnosis not present

## 2023-07-16 DIAGNOSIS — L659 Nonscarring hair loss, unspecified: Secondary | ICD-10-CM | POA: Diagnosis not present

## 2023-07-30 ENCOUNTER — Emergency Department (HOSPITAL_COMMUNITY): Admission: EM | Admit: 2023-07-30 | Discharge: 2023-07-30 | Disposition: A | Discharge status: Home / Self Care

## 2023-07-30 ENCOUNTER — Emergency Department (HOSPITAL_COMMUNITY)

## 2023-07-30 ENCOUNTER — Other Ambulatory Visit: Payer: Self-pay

## 2023-07-30 ENCOUNTER — Encounter (HOSPITAL_COMMUNITY): Payer: Self-pay | Admitting: Emergency Medicine

## 2023-07-30 DIAGNOSIS — K279 Peptic ulcer, site unspecified, unspecified as acute or chronic, without hemorrhage or perforation: Secondary | ICD-10-CM | POA: Diagnosis not present

## 2023-07-30 DIAGNOSIS — R9431 Abnormal electrocardiogram [ECG] [EKG]: Secondary | ICD-10-CM | POA: Diagnosis not present

## 2023-07-30 DIAGNOSIS — R109 Unspecified abdominal pain: Secondary | ICD-10-CM | POA: Diagnosis not present

## 2023-07-30 DIAGNOSIS — E876 Hypokalemia: Secondary | ICD-10-CM | POA: Diagnosis not present

## 2023-07-30 DIAGNOSIS — R1013 Epigastric pain: Secondary | ICD-10-CM | POA: Diagnosis not present

## 2023-07-30 DIAGNOSIS — Z7982 Long term (current) use of aspirin: Secondary | ICD-10-CM | POA: Diagnosis not present

## 2023-07-30 LAB — COMPREHENSIVE METABOLIC PANEL WITH GFR
ALT: 16 U/L (ref 0–44)
AST: 19 U/L (ref 15–41)
Albumin: 3.6 g/dL (ref 3.5–5.0)
Alkaline Phosphatase: 64 U/L (ref 38–126)
Anion gap: 10 (ref 5–15)
BUN: 15 mg/dL (ref 6–20)
CO2: 24 mmol/L (ref 22–32)
Calcium: 8.8 mg/dL — ABNORMAL LOW (ref 8.9–10.3)
Chloride: 103 mmol/L (ref 98–111)
Creatinine, Ser: 0.94 mg/dL (ref 0.44–1.00)
GFR, Estimated: >60 mL/min (ref >60)
Glucose, Bld: 98 mg/dL (ref 70–99)
Potassium: 3.3 mmol/L — ABNORMAL LOW (ref 3.5–5.1)
Sodium: 137 mmol/L (ref 135–145)
Total Bilirubin: 0.9 mg/dL (ref 0.0–1.2)
Total Protein: 7.5 g/dL (ref 6.5–8.1)

## 2023-07-30 LAB — URINALYSIS, ROUTINE W REFLEX MICROSCOPIC
Bilirubin Urine: NEGATIVE
Glucose, UA: NEGATIVE mg/dL
Hgb urine dipstick: NEGATIVE
Ketones, ur: NEGATIVE mg/dL
Nitrite: NEGATIVE
Protein, ur: NEGATIVE mg/dL
Specific Gravity, Urine: 1.018 (ref 1.005–1.030)
WBC, UA: >50 WBC/hpf (ref 0–5)
pH: 5 (ref 5.0–8.0)

## 2023-07-30 LAB — CBC
HCT: 36.3 % (ref 36.0–46.0)
Hemoglobin: 12 g/dL (ref 12.0–15.0)
MCH: 31.3 pg (ref 26.0–34.0)
MCHC: 33.1 g/dL (ref 30.0–36.0)
MCV: 94.5 fL (ref 80.0–100.0)
Platelets: 308 K/uL (ref 150–400)
RBC: 3.84 MIL/uL — ABNORMAL LOW (ref 3.87–5.11)
RDW: 12.2 % (ref 11.5–15.5)
WBC: 8.1 K/uL (ref 4.0–10.5)
nRBC: 0 % (ref 0.0–0.2)

## 2023-07-30 LAB — LIPASE, BLOOD: Lipase: 42 U/L (ref 11–51)

## 2023-07-30 MED ORDER — SUCRALFATE 1 G PO TABS: 1.0000 g | ORAL_TABLET | Freq: Three times a day (TID) | ORAL | 0 refills | Status: AC

## 2023-07-30 MED ORDER — PANTOPRAZOLE SODIUM 20 MG PO TBEC
40.0000 mg | DELAYED_RELEASE_TABLET | Freq: Every day | ORAL | 0 refills | Status: AC
Start: 2023-07-30 — End: 2023-08-13

## 2023-07-30 MED ORDER — ALUM & MAG HYDROXIDE-SIMETH 200-200-20 MG/5ML PO SUSP
30.0000 mL | Freq: Once | ORAL | Status: AC
  Administered 2023-07-30: 30 mL via ORAL
  Filled 2023-07-30: qty 30

## 2023-07-30 MED ORDER — IOHEXOL 300 MG/ML  SOLN
100.0000 mL | Freq: Once | INTRAMUSCULAR | Status: AC | PRN
  Administered 2023-07-30: 100 mL via INTRAVENOUS

## 2023-07-30 NOTE — ED Provider Notes (Signed)
 [facility] EMERGENCY DEPARTMENT AT [facility] Provider Note   CSN: [id number] Arrival date & time: 07/30/23  1533     Patient presents with: Abdominal Pain   Madison Armstrong is a 58 y.o. female.   This a 58 year old female presenting emergency department for epigastric Donnell pain.  Present for the past 3 months.  Worsening over the past couple weeks.  Worse with food.  Described as an ache/burning sensation.  Associated with some nausea and vomiting intermittently.  Has been taking NSAIDs regularly for knee pain.  No hematemesis, melena.   Abdominal Pain      Prior to Admission medications   Medication Sig Start Date End Date Taking? Authorizing Provider  albuterol  (VENTOLIN  HFA) 108 (90 Base) MCG/ACT inhaler Inhale 1-2 puffs into the lungs every 6 (six) hours as needed for wheezing or shortness of breath. 11/24/19   Avegno, Komlanvi S, FNP  aspirin EC 325 MG tablet Take 1 tablet by mouth 2 (two) times daily. 02/14/21   [provider]  benzonatate  (TESSALON ) 100 MG capsule Take 1 capsule (100 mg total) by mouth 3 (three) times daily as needed for cough. 02/19/23   Gladis Elsie BROCKS, PA-C  CELEBREX 200 MG capsule Take 200 mg by mouth 2 (two) times daily. 02/16/19   [provider]  estradiol  (VIVELLE -DOT) 0.025 MG/24HR Place 1 patch onto the skin 2 (two) times a week. 01/25/19   [provider]  estradiol  (VIVELLE -DOT) 0.025 MG/24HR Place onto the skin. 04/05/19   [provider]  Liraglutide -Weight Management (SAXENDA) 18 MG/3ML SOPN Waiting to start after surgery 12/04/20   [provider]  meclizine  (ANTIVERT ) 25 MG tablet Take 1 tablet (25 mg total) by mouth 3 (three) times daily as needed for dizziness. 02/19/19   Freddi Hamilton, MD  nadolol (CORGARD) 20 MG tablet Take by mouth. 04/12/19   [provider]    Allergies: Tramadol    Review of Systems  Gastrointestinal:  Positive for abdominal pain.    Updated  Vital Signs BP 132/76 (BP Location: Left Arm)   Pulse 65   Temp 97.6 F (36.4 C) (Oral)   Resp 16   Ht 5' 9 (1.753 m)   Wt 83 kg   SpO2 99%   BMI 27.02 kg/m   Physical Exam Vitals and nursing note reviewed.  Constitutional:      General: She is not in acute distress.    Appearance: She is not toxic-appearing.  HENT:     Head: Normocephalic.  Cardiovascular:     Rate and Rhythm: Normal rate.  Pulmonary:     Effort: Pulmonary effort is normal.  Abdominal:     Palpations: Abdomen is soft.     Tenderness: There is abdominal tenderness (minor]) in the epigastric area.  Skin:    General: Skin is warm and dry.     Capillary Refill: Capillary refill takes less than 2 seconds.  Neurological:     General: No focal deficit present.     Mental Status: She is alert.     (all labs ordered are listed, but only abnormal results are displayed) Labs Reviewed  COMPREHENSIVE METABOLIC PANEL WITH GFR - Abnormal; Notable for the following components:      Result Value   Potassium 3.3 (*)    Calcium 8.8 (*)    All other components within normal limits  CBC - Abnormal; Notable for the following components:   RBC 3.84 (*)    All other components within  normal limits  URINALYSIS, ROUTINE W REFLEX MICROSCOPIC - Abnormal; Notable for the following components:   APPearance HAZY (*)    Leukocytes,Ua LARGE (*)    Bacteria, UA RARE (*)    All other components within normal limits  LIPASE, BLOOD    EKG: EKG Interpretation Date/Time:  Wednesday July 30 2023 16:22:38 EDT Ventricular Rate:  69 PR Interval:  152 QRS Duration:  43 QT Interval:  507 QTC Calculation: 544 R Axis:   45  Text Interpretation: Sinus rhythm Low voltage, precordial leads Borderline T abnormalities, anterior leads Prolonged QT interval Confirmed by Neysa Clap [phone]) on 07/30/2023 5:55:25 PM  Radiology: CT ABDOMEN PELVIS W CONTRAST Result Date: 07/30/2023 CLINICAL DATA:  Abdominal pain. EXAM: CT ABDOMEN AND  PELVIS WITH CONTRAST TECHNIQUE: Multidetector CT imaging of the abdomen and pelvis was performed using the standard protocol following bolus administration of intravenous contrast. RADIATION DOSE REDUCTION: This exam was performed according to the departmental dose-optimization program which includes automated exposure control, adjustment of the mA and/or kV according to patient size and/or use of iterative reconstruction technique. CONTRAST:  100mL OMNIPAQUE  IOHEXOL  300 MG/ML  SOLN COMPARISON:  None Available. FINDINGS: Lower chest: Lung bases are clear. Heart is at the upper limits of normal in size. No pericardial or pleural effusion. Distal esophagus is grossly unremarkable. Hepatobiliary: Liver and gallbladder are unremarkable. No biliary ductal dilatation. Pancreas: Negative. Spleen: Negative. Adrenals/Urinary Tract: Adrenal glands and kidneys are unremarkable. Ureters are decompressed. Bladder is relatively low in volume. Stomach/Bowel: Stomach, small bowel, appendix and colon are unremarkable. Vascular/Lymphatic: Vascular structures are unremarkable. No pathologically enlarged lymph nodes. Reproductive: No adnexal Mass. Other: No free fluid.  Mesenteries and peritoneum are unremarkable. Musculoskeletal: Small well-circumscribed lucent lesion in the left L5 superior facet is likely benign. Degenerative changes in the spine. IMPRESSION: No findings to explain the patient's pain. Electronically Signed   By: Newell Eke M.D.   On: 07/30/2023 17:42     Procedures   Medications Ordered in the ED  iohexol  (OMNIPAQUE ) 300 MG/ML solution 100 mL (100 mLs Intravenous Contrast Given 07/30/23 1723)  alum & mag hydroxide-simeth (MAALOX/MYLANTA) 200-200-20 MG/5ML suspension 30 mL (30 mLs Oral Given 07/30/23 1752)    Clinical Course as of 07/30/23 1815  Wed Jul 30, 2023  1755 CT ABDOMEN PELVIS W CONTRAST IMPRESSION: No findings to explain the patient's pain.   Electronically Signed   [TY]    Clinical  Course User Index [TY] Neysa Clap PARAS, DO                                 Medical Decision Making This is a 58 year old female presenting emergency department for epigastric domino pain.  She is afebrile nontachycardic hemodynamically stable.  Physical exam reassuring with soft nonsurgical abdomen, some minor tenderness in her epigastrium.  Complex past medical history reviewed and includes obesity, diabetes.  Husband notes that patient has been taking NSAIDs regularly for knee pain.  History is consistent with gastritis versus ulcer.  Did obtain labs and imaging to exclude other processes such as pancreatitis, hepatobiliary disease, obstruction.  CBC without leukocytosis.  No anemia.  Her comprehensive panel with no significant metabolic derangements other than minor low potassium.  No transaminitis to suggest hepatobiliary disease.  Lipase is normal.  Pancreatitis unlikely.  UA not indicative of urinary tract infection.  CT scan without acute abnormality.  Given GI cocktail.  Given negative workup will discharge in  stable condition, follow-up with PCP.  Will give Protonix  and Sucre fate  Amount and/or Complexity of Data Reviewed Labs: ordered. Radiology:  Decision-making details documented in ED Course. ECG/medicine tests: independent interpretation performed.    Details: Sinus rhythm.  No STEMI  Risk OTC drugs. Decision regarding hospitalization. Diagnosis or treatment significantly limited by social determinants of health. Risk Details: Lives at home with husband.        Final diagnoses:  None    ED Discharge Orders     None          Neysa Caron PARAS, DO 07/30/23 1815

## 2023-07-30 NOTE — Discharge Instructions (Addendum)
 We have prescribed you medications to help with symptoms.  Please follow-up with your primary doctor for further testing for H. pylori.  Return immediately for fevers, chills, severe pain, inability to eat or drink due to nausea vomiting, dark black stool, lightheadedness, passout or any new or worsening symptoms that are concerning to you.  Also, please stop taking NSAIDs such as ibuprofen.

## 2023-07-30 NOTE — ED Provider Triage Note (Signed)
 Emergency Medicine Provider Triage Evaluation Note  Madison Armstrong , a 58 y.o. female  was evaluated in triage.  Pt complains of epigastric abdominal pain.  Has been ongoing over the last few months however the last few days has noted she has had worsening pain whenever she eats or drinks anything.  Was initially taking some NSAIDs due to knee pain however has not been taking them due to the pain.  She tried starting on a PPI at home.  1 episode of NBNB emesis.  No changes in stools.  No bloody stool.  Review of Systems  Positive: Abd pain Negative:   Physical Exam  BP 132/76 (BP Location: Left Arm)   Pulse 65   Temp 97.6 F (36.4 C) (Oral)   Resp 16   Ht 5' 9 (1.753 m)   Wt 83 kg   SpO2 99%   BMI 27.02 kg/m  Gen:   Awake, no distress   Resp:  Normal effort  MSK:   Moves extremities without difficulty  Other:    Medical Decision Making  Medically screening exam initiated at 4:08 PM.  Appropriate orders placed.  MATSUKO KRETZ was informed that the remainder of the evaluation will be completed by another provider, this initial triage assessment does not replace that evaluation, and the importance of remaining in the ED until their evaluation is complete.  Abd pain   Jhordyn Hoopingarner A, PA-C 07/30/23 [id number]

## 2023-07-30 NOTE — ED Triage Notes (Signed)
 Pt comes in for upper gi pain she has lost weight in the last year on zepbound. She has been have the upper gi pain for 3 months.

## 2023-08-01 DIAGNOSIS — E876 Hypokalemia: Secondary | ICD-10-CM | POA: Diagnosis not present

## 2023-08-01 DIAGNOSIS — R9431 Abnormal electrocardiogram [ECG] [EKG]: Secondary | ICD-10-CM | POA: Diagnosis not present

## 2023-08-01 DIAGNOSIS — M25561 Pain in right knee: Secondary | ICD-10-CM | POA: Diagnosis not present

## 2023-08-01 DIAGNOSIS — R1013 Epigastric pain: Secondary | ICD-10-CM | POA: Diagnosis not present

## 2023-08-06 DIAGNOSIS — R9431 Abnormal electrocardiogram [ECG] [EKG]: Secondary | ICD-10-CM | POA: Diagnosis not present

## 2023-08-06 DIAGNOSIS — Z6838 Body mass index (BMI) 38.0-38.9, adult: Secondary | ICD-10-CM | POA: Diagnosis not present

## 2023-08-07 DIAGNOSIS — K279 Peptic ulcer, site unspecified, unspecified as acute or chronic, without hemorrhage or perforation: Secondary | ICD-10-CM | POA: Diagnosis not present

## 2023-08-07 DIAGNOSIS — B379 Candidiasis, unspecified: Secondary | ICD-10-CM | POA: Diagnosis not present

## 2023-08-07 DIAGNOSIS — R7989 Other specified abnormal findings of blood chemistry: Secondary | ICD-10-CM | POA: Diagnosis not present

## 2023-08-07 DIAGNOSIS — R1013 Epigastric pain: Secondary | ICD-10-CM | POA: Diagnosis not present

## 2023-08-07 DIAGNOSIS — R399 Unspecified symptoms and signs involving the genitourinary system: Secondary | ICD-10-CM | POA: Diagnosis not present

## 2023-08-18 DIAGNOSIS — G4733 Obstructive sleep apnea (adult) (pediatric): Secondary | ICD-10-CM | POA: Diagnosis not present

## 2023-09-02 DIAGNOSIS — Z1211 Encounter for screening for malignant neoplasm of colon: Secondary | ICD-10-CM | POA: Diagnosis not present

## 2023-09-02 DIAGNOSIS — Z1231 Encounter for screening mammogram for malignant neoplasm of breast: Secondary | ICD-10-CM | POA: Diagnosis not present

## 2023-09-02 DIAGNOSIS — M25561 Pain in right knee: Secondary | ICD-10-CM | POA: Diagnosis not present

## 2023-09-02 DIAGNOSIS — E7849 Other hyperlipidemia: Secondary | ICD-10-CM | POA: Diagnosis not present

## 2023-09-02 DIAGNOSIS — E669 Obesity, unspecified: Secondary | ICD-10-CM | POA: Diagnosis not present

## 2023-09-02 DIAGNOSIS — R110 Nausea: Secondary | ICD-10-CM | POA: Diagnosis not present

## 2023-09-02 DIAGNOSIS — Z124 Encounter for screening for malignant neoplasm of cervix: Secondary | ICD-10-CM | POA: Diagnosis not present

## 2023-09-02 DIAGNOSIS — Z0001 Encounter for general adult medical examination with abnormal findings: Secondary | ICD-10-CM | POA: Diagnosis not present

## 2023-09-02 DIAGNOSIS — Z8041 Family history of malignant neoplasm of ovary: Secondary | ICD-10-CM | POA: Diagnosis not present

## 2023-09-02 DIAGNOSIS — R7989 Other specified abnormal findings of blood chemistry: Secondary | ICD-10-CM | POA: Diagnosis not present

## 2023-09-03 DIAGNOSIS — R1013 Epigastric pain: Secondary | ICD-10-CM | POA: Diagnosis not present

## 2023-09-03 DIAGNOSIS — K219 Gastro-esophageal reflux disease without esophagitis: Secondary | ICD-10-CM | POA: Diagnosis not present

## 2023-09-09 DIAGNOSIS — R1033 Periumbilical pain: Secondary | ICD-10-CM | POA: Diagnosis not present

## 2023-09-09 DIAGNOSIS — K219 Gastro-esophageal reflux disease without esophagitis: Secondary | ICD-10-CM | POA: Diagnosis not present

## 2023-09-09 DIAGNOSIS — R110 Nausea: Secondary | ICD-10-CM | POA: Diagnosis not present

## 2023-09-09 DIAGNOSIS — R1013 Epigastric pain: Secondary | ICD-10-CM | POA: Diagnosis not present

## 2023-09-25 DIAGNOSIS — M25552 Pain in left hip: Secondary | ICD-10-CM | POA: Diagnosis not present

## 2023-10-23 DIAGNOSIS — M25361 Other instability, right knee: Secondary | ICD-10-CM | POA: Diagnosis not present

## 2023-10-23 DIAGNOSIS — Z96651 Presence of right artificial knee joint: Secondary | ICD-10-CM | POA: Diagnosis not present

## 2023-10-23 DIAGNOSIS — G8929 Other chronic pain: Secondary | ICD-10-CM | POA: Diagnosis not present

## 2023-10-23 DIAGNOSIS — M25561 Pain in right knee: Secondary | ICD-10-CM | POA: Diagnosis not present

## 2023-11-17 DIAGNOSIS — G4733 Obstructive sleep apnea (adult) (pediatric): Secondary | ICD-10-CM | POA: Diagnosis not present

## 2023-11-27 DIAGNOSIS — M25552 Pain in left hip: Secondary | ICD-10-CM | POA: Diagnosis not present

## 2023-12-08 DIAGNOSIS — M25551 Pain in right hip: Secondary | ICD-10-CM | POA: Diagnosis not present

## 2023-12-08 DIAGNOSIS — E669 Obesity, unspecified: Secondary | ICD-10-CM | POA: Diagnosis not present

## 2023-12-08 DIAGNOSIS — E7849 Other hyperlipidemia: Secondary | ICD-10-CM | POA: Diagnosis not present

## 2023-12-08 DIAGNOSIS — M25561 Pain in right knee: Secondary | ICD-10-CM | POA: Diagnosis not present
